# Patient Record
Sex: Male | Born: 1975
Health system: Southern US, Community
[De-identification: ages and names within clinical notes are randomized; demographics above are authoritative.]

## PROBLEM LIST (undated history)

## (undated) DIAGNOSIS — E669 Obesity, unspecified: Secondary | ICD-10-CM

## (undated) DIAGNOSIS — Q100 Congenital ptosis: Secondary | ICD-10-CM

## (undated) HISTORY — DX: Obesity, unspecified: E66.9

## (undated) HISTORY — DX: Congenital ptosis: Q10.0

---

## 1998-09-29 ENCOUNTER — Emergency Department (HOSPITAL_COMMUNITY): Admission: EM | Admit: 1998-09-29 | Discharge: 1998-09-29 | Payer: Self-pay | Admitting: Emergency Medicine

## 1998-11-24 ENCOUNTER — Emergency Department (HOSPITAL_COMMUNITY): Admission: EM | Admit: 1998-11-24 | Discharge: 1998-11-24 | Payer: Self-pay | Admitting: Emergency Medicine

## 1998-11-24 ENCOUNTER — Encounter: Payer: Self-pay | Admitting: Emergency Medicine

## 2000-07-28 ENCOUNTER — Emergency Department (HOSPITAL_COMMUNITY): Admission: EM | Admit: 2000-07-28 | Discharge: 2000-07-28 | Payer: Self-pay | Admitting: Internal Medicine

## 2001-09-01 ENCOUNTER — Emergency Department (HOSPITAL_COMMUNITY): Admission: EM | Admit: 2001-09-01 | Discharge: 2001-09-01 | Payer: Self-pay | Admitting: Emergency Medicine

## 2001-10-05 ENCOUNTER — Encounter: Payer: Self-pay | Admitting: Emergency Medicine

## 2001-10-05 ENCOUNTER — Emergency Department (HOSPITAL_COMMUNITY): Admission: EM | Admit: 2001-10-05 | Discharge: 2001-10-05 | Payer: Self-pay | Admitting: Emergency Medicine

## 2003-01-21 ENCOUNTER — Emergency Department (HOSPITAL_COMMUNITY): Admission: EM | Admit: 2003-01-21 | Discharge: 2003-01-21 | Payer: Self-pay | Admitting: Emergency Medicine

## 2006-11-08 ENCOUNTER — Emergency Department (HOSPITAL_COMMUNITY): Admission: EM | Admit: 2006-11-08 | Discharge: 2006-11-08 | Payer: Self-pay | Admitting: Emergency Medicine

## 2007-12-08 ENCOUNTER — Emergency Department (HOSPITAL_COMMUNITY): Admission: EM | Admit: 2007-12-08 | Discharge: 2007-12-08 | Payer: Self-pay | Admitting: Emergency Medicine

## 2008-04-29 ENCOUNTER — Encounter: Admission: RE | Admit: 2008-04-29 | Discharge: 2008-04-29 | Payer: Self-pay | Admitting: Internal Medicine

## 2009-03-28 ENCOUNTER — Emergency Department (HOSPITAL_COMMUNITY): Admission: EM | Admit: 2009-03-28 | Discharge: 2009-03-29 | Payer: Self-pay | Admitting: Emergency Medicine

## 2010-04-17 ENCOUNTER — Emergency Department (HOSPITAL_COMMUNITY)
Admission: EM | Admit: 2010-04-17 | Discharge: 2010-04-17 | Payer: Self-pay | Source: Home / Self Care | Admitting: Emergency Medicine

## 2010-07-18 LAB — WOUND CULTURE: Gram Stain: NONE SEEN

## 2011-12-03 ENCOUNTER — Ambulatory Visit (INDEPENDENT_AMBULATORY_CARE_PROVIDER_SITE_OTHER): Payer: BC Managed Care – PPO | Admitting: Internal Medicine

## 2011-12-03 VITALS — BP 103/66 | HR 52 | Temp 97.7°F | Resp 18 | Ht 69.0 in | Wt 191.0 lb

## 2011-12-03 DIAGNOSIS — R42 Dizziness and giddiness: Secondary | ICD-10-CM

## 2011-12-03 DIAGNOSIS — M791 Myalgia, unspecified site: Secondary | ICD-10-CM

## 2011-12-03 DIAGNOSIS — IMO0001 Reserved for inherently not codable concepts without codable children: Secondary | ICD-10-CM

## 2011-12-03 DIAGNOSIS — R5381 Other malaise: Secondary | ICD-10-CM

## 2011-12-03 DIAGNOSIS — Z0000 Encounter for general adult medical examination without abnormal findings: Secondary | ICD-10-CM

## 2011-12-03 DIAGNOSIS — R5383 Other fatigue: Secondary | ICD-10-CM

## 2011-12-03 LAB — POCT CBC
Granulocyte percent: 31.7 %G — AB (ref 37–80)
HCT, POC: 46.4 % (ref 43.5–53.7)
Hemoglobin: 14.8 g/dL (ref 14.1–18.1)
Lymph, poc: 2.8 (ref 0.6–3.4)
MCH, POC: 29.1 pg (ref 27–31.2)
MCHC: 31.9 g/dL (ref 31.8–35.4)
MCV: 91.2 fL (ref 80–97)
MID (cbc): 0.3 (ref 0–0.9)
MPV: 12.1 fL (ref 0–99.8)
POC Granulocyte: 1.4 — AB (ref 2–6.9)
POC LYMPH PERCENT: 61.3 %L — AB (ref 10–50)
POC MID %: 7 %M (ref 0–12)
Platelet Count, POC: 178 10*3/uL (ref 142–424)
RBC: 5.09 M/uL (ref 4.69–6.13)
RDW, POC: 13.9 %
WBC: 4.5 10*3/uL — AB (ref 4.6–10.2)

## 2011-12-03 LAB — POCT URINALYSIS DIPSTICK
Blood, UA: NEGATIVE
Nitrite, UA: NEGATIVE
Urobilinogen, UA: 4
pH, UA: 7

## 2011-12-03 NOTE — Progress Notes (Signed)
Subjective:    Patient ID: Arthur Gallagher, male    DOB: Nov 12, 1975, 36 y.o.   MRN: [id number]  HPIHere for a complete physical examination  He complains of fatigue over the last 6 weeks He also has a feeling of dullness in his legs every night, in the big muscles of the legs, with occasional aching pain He works a labor job with steel/long hours He's not been working out in [employer] his usual Appetite is fine/no weight loss/no injury/no dehydration Sleeps well He believes his underlying problem has to do with an infection that is spreading from his mouth to his body/his teeth are in very terrible condition His dentist tells him that he was born with "soft teeth" and that  little he can do about this except replace them  No known underlying illnesses  Family history stable Social history-married 2 kids Review of Systems  Constitutional: Negative for fever, appetite change and unexpected weight change.  HENT: Negative for hearing loss, trouble swallowing and neck pain.   Eyes: Negative for visual disturbance.  Respiratory: Negative for cough, chest tightness and shortness of breath.   Cardiovascular: Negative for chest pain, palpitations and leg swelling.  Gastrointestinal: Negative for nausea, vomiting, abdominal pain, diarrhea and constipation.  Genitourinary: Negative for dysuria, frequency, decreased urine volume, difficulty urinating and genital sores.  Musculoskeletal: Negative for back pain, joint swelling and gait problem.  Neurological: Positive for dizziness.  Hematological: Does not bruise/bleed easily.  Psychiatric/Behavioral: Negative for behavioral problems, disturbed wake/sleep cycle and dysphoric mood.  His dizziness is occasional brief and occurs with position change very early in the morning and disappears by the time he is at work/no dizziness while working hard     Objective:   Physical Exam Filed Vitals:   12/03/11 2038  BP: 103/66  Pulse: 52  Temp: 97.7 F  (36.5 C)  Resp: 18   No acute distress/well-developed well-nourished HEENT is clear except for very slight ptosis but no loss of upward gaze No nodes or thyromegaly Lungs clear Heart regular without murmur or click Abdomen soft without organomegaly or tenderness Extremities clear without edema Joints with full range of motion no pain The large muscles of the leg show no masses defects or tenderness Spine is straight/straight leg raise is normal Neurological is intact       Results for orders placed in visit on 12/03/11  POCT CBC      Component Value Range   WBC 4.5 (*) 4.6 - 10.2 K/uL   Lymph, poc 2.8  0.6 - 3.4   POC LYMPH PERCENT 61.3 (*) 10 - 50 %L   MID (cbc) 0.3  0 - 0.9   POC MID % 7.0  0 - 12 %M   POC Granulocyte 1.4 (*) 2 - 6.9   Granulocyte percent 31.7 (*) 37 - 80 %G   RBC 5.09  4.69 - 6.13 M/uL   Hemoglobin 14.8  14.1 - 18.1 g/dL   HCT, POC 40.9  81.1 - 53.7 %   MCV 91.2  80 - 97 fL   MCH, POC 29.1  27 - 31.2 pg   MCHC 31.9  31.8 - 35.4 g/dL   RDW, POC 91.4     Platelet Count, POC 178  142 - 424 K/uL   MPV 12.1  0 - 99.8 fL  POCT URINALYSIS DIPSTICK      Component Value Range   Color, UA yellow     Clarity, UA clear     Glucose,  UA neg     Bilirubin, UA neg     Ketones, UA neg     Spec Grav, UA 1.025     Blood, UA neg     pH, UA 7.0     Protein, UA neg     Urobilinogen, UA 4.0     Nitrite, UA neg     Leukocytes, UA Negative      Assessment & Plan:   1. Myalgia  TSH, CK, Sedimentation Rate  2. Fatigue  POCT CBC, Comprehensive metabolic panel, Sedimentation Rate  3. Dizzy  POCT CBC, Sedimentation Rate  4. Annual physical exam  Lipid panel, POCT urinalysis dipstick  5.  Poor dentition  Screening labs to rule out myositis Other routine labs Encouraged to resume exercise program Encouraged dental followup is planned for removal of all teeth

## 2011-12-04 ENCOUNTER — Encounter: Payer: Self-pay | Admitting: Internal Medicine

## 2011-12-04 LAB — COMPREHENSIVE METABOLIC PANEL
ALT: 12 U/L (ref 0–53)
AST: 17 U/L (ref 0–37)
Albumin: 4.1 g/dL (ref 3.5–5.2)
Alkaline Phosphatase: 48 U/L (ref 39–117)
BUN: 10 mg/dL (ref 6–23)
CO2: 29 mEq/L (ref 19–32)
Calcium: 9 mg/dL (ref 8.4–10.5)
Chloride: 104 mEq/L (ref 96–112)
Creat: 1.03 mg/dL (ref 0.50–1.35)
Glucose, Bld: 86 mg/dL (ref 70–99)
Potassium: 4 mEq/L (ref 3.5–5.3)
Sodium: 140 mEq/L (ref 135–145)
Total Bilirubin: 1.1 mg/dL (ref 0.3–1.2)
Total Protein: 7.1 g/dL (ref 6.0–8.3)

## 2011-12-04 LAB — CK: Total CK: 221 U/L (ref 7–232)

## 2011-12-04 LAB — LIPID PANEL
LDL Cholesterol: 115 mg/dL — ABNORMAL HIGH (ref 0–99)
Total CHOL/HDL Ratio: 4.3 Ratio
VLDL: 24 mg/dL (ref 0–40)

## 2011-12-04 LAB — TSH: TSH: 2.25 u[IU]/mL (ref 0.350–4.500)

## 2011-12-04 LAB — SEDIMENTATION RATE: Sed Rate: 1 mm/hr (ref 0–16)

## 2011-12-08 ENCOUNTER — Encounter: Payer: Self-pay | Admitting: Internal Medicine

## 2012-08-04 ENCOUNTER — Encounter (HOSPITAL_COMMUNITY): Payer: Self-pay | Admitting: Emergency Medicine

## 2012-08-04 ENCOUNTER — Emergency Department (INDEPENDENT_AMBULATORY_CARE_PROVIDER_SITE_OTHER)
Admission: EM | Admit: 2012-08-04 | Discharge: 2012-08-04 | Disposition: A | Discharge status: Home / Self Care | Payer: BC Managed Care – PPO | Source: Home / Self Care | Attending: Family Medicine | Admitting: Family Medicine

## 2012-08-04 DIAGNOSIS — L02419 Cutaneous abscess of limb, unspecified: Secondary | ICD-10-CM

## 2012-08-04 DIAGNOSIS — IMO0002 Reserved for concepts with insufficient information to code with codable children: Secondary | ICD-10-CM

## 2012-08-04 MED ORDER — DOXYCYCLINE HYCLATE 100 MG PO CAPS: 100.0000 mg | ORAL_CAPSULE | Freq: Two times a day (BID) | ORAL | Status: DC

## 2012-08-04 MED ORDER — HYDROCODONE-ACETAMINOPHEN 5-325 MG PO TABS: 1.0000 | ORAL_TABLET | Freq: Four times a day (QID) | ORAL | Status: DC | PRN

## 2012-08-04 NOTE — ED Provider Notes (Signed)
History     CSN: [id number]  Arrival date & time 08/04/12  1732   First MD Initiated Contact with Patient 08/04/12 1744      Chief Complaint  Patient presents with  . Abscess    (Consider location/radiation/quality/duration/timing/severity/associated sxs/prior treatment) Patient is a 37 y.o. male presenting with abscess. The history is provided by the patient.  Abscess Location:  Shoulder/arm Shoulder/arm abscess location:  L axilla Abscess quality: fluctuance and induration   Red streaking: no   Duration:  2 days Progression:  Worsening Chronicity:  New   History reviewed. No pertinent past medical history.  History reviewed. No pertinent past surgical history.  No family history on file.  History  Substance Use Topics  . Smoking status: Former Games developer  . Smokeless tobacco: Never Used  . Alcohol Use: No      Review of Systems  Constitutional: Negative.   Skin: Positive for rash.    Allergies  Review of patient's allergies indicates no known allergies.  Home Medications   Current Outpatient Rx  Name  Route  Sig  Dispense  Refill  . doxycycline (VIBRAMYCIN) 100 MG capsule   Oral   Take 1 capsule (100 mg total) by mouth 2 (two) times daily.   20 capsule   0   . HYDROcodone-acetaminophen (NORCO/VICODIN) 5-325 MG per tablet   Oral   Take 1 tablet by mouth every 6 (six) hours as needed for pain.   10 tablet   0     BP 122/79  Pulse 90  Temp(Src) 98.4 F (36.9 C) (Oral)  Resp 20  SpO2 98%  Physical Exam  Constitutional: He is oriented to person, place, and time. He appears well-developed and well-nourished.  Musculoskeletal: He exhibits tenderness.  Neurological: He is alert and oriented to person, place, and time.  Skin: Skin is warm and dry.  tendeness of abcess to left axilla    ED Course  INCISION AND DRAINAGE Date/Time: 08/04/2012 6:49 PM Performed by: Linna Hoff Authorized by: Bradd Canary D Consent: Verbal consent  obtained. Consent given by: patient Type: abscess Body area: upper extremity Local anesthetic: topical anesthetic Patient sedated: no Scalpel size: 11 Incision type: single straight Complexity: simple Drainage: purulent Drainage amount: moderate Wound treatment: wound left open Patient tolerance: Patient tolerated the procedure well with no immediate complications. Comments: Culture obtained.dsd.   (including critical care time)  Labs Reviewed  CULTURE, ROUTINE-ABSCESS   No results found.   1. Axillary abscess       MDM  I+d performed.        Linna Hoff, MD 08/04/12 [phone]

## 2012-08-04 NOTE — ED Notes (Signed)
Abscess under left arm, noticed this Saturday.  Area is getting larger and more tender.

## 2012-08-06 ENCOUNTER — Encounter (HOSPITAL_COMMUNITY): Payer: Self-pay | Admitting: *Deleted

## 2012-08-06 ENCOUNTER — Emergency Department (HOSPITAL_COMMUNITY): Payer: BC Managed Care – PPO

## 2012-08-06 ENCOUNTER — Emergency Department (HOSPITAL_COMMUNITY)
Admission: EM | Admit: 2012-08-06 | Discharge: 2012-08-07 | Disposition: A | Discharge status: Home / Self Care | Payer: BC Managed Care – PPO | Attending: Emergency Medicine | Admitting: Emergency Medicine

## 2012-08-06 DIAGNOSIS — IMO0002 Reserved for concepts with insufficient information to code with codable children: Secondary | ICD-10-CM | POA: Insufficient documentation

## 2012-08-06 DIAGNOSIS — L03019 Cellulitis of unspecified finger: Secondary | ICD-10-CM | POA: Insufficient documentation

## 2012-08-06 DIAGNOSIS — L03012 Cellulitis of left finger: Secondary | ICD-10-CM

## 2012-08-06 DIAGNOSIS — L02519 Cutaneous abscess of unspecified hand: Secondary | ICD-10-CM | POA: Insufficient documentation

## 2012-08-06 NOTE — ED Notes (Signed)
Pt states he has a piece of steel in his finger from 2 days ago, swollen and painful.

## 2012-08-07 LAB — CULTURE, ROUTINE-ABSCESS

## 2012-08-07 MED ORDER — SULFAMETHOXAZOLE-TRIMETHOPRIM 800-160 MG PO TABS: 1.0000 | ORAL_TABLET | Freq: Two times a day (BID) | ORAL | Status: DC

## 2012-08-07 MED ORDER — SULFAMETHOXAZOLE-TMP DS 800-160 MG PO TABS
1.0000 | ORAL_TABLET | Freq: Once | ORAL | Status: AC
  Administered 2012-08-07: 1 via ORAL
  Filled 2012-08-07: qty 1

## 2012-08-07 MED ORDER — CEPHALEXIN 250 MG PO CAPS: 250.0000 mg | ORAL_CAPSULE | Freq: Once | ORAL | Status: DC

## 2012-08-07 MED ORDER — CEPHALEXIN 500 MG PO CAPS: 500.0000 mg | ORAL_CAPSULE | Freq: Four times a day (QID) | ORAL | Status: DC

## 2012-08-07 MED ORDER — HYDROCODONE-ACETAMINOPHEN 5-325 MG PO TABS
1.0000 | ORAL_TABLET | Freq: Once | ORAL | Status: AC
  Administered 2012-08-07: 1 via ORAL
  Filled 2012-08-07: qty 1

## 2012-08-07 MED ORDER — CEPHALEXIN 500 MG PO CAPS
500.0000 mg | ORAL_CAPSULE | Freq: Once | ORAL | Status: AC
  Administered 2012-08-07: 500 mg via ORAL
  Filled 2012-08-07: qty 1

## 2012-08-07 NOTE — ED Provider Notes (Signed)
History     CSN: [id number]  Arrival date & time 08/06/12  2244   First MD Initiated Contact with Patient 08/07/12 0011      Chief Complaint  Patient presents with  . Finger Injury    rt index    (Consider location/radiation/quality/duration/timing/severity/associated sxs/prior treatment) HPI Comments: Patient presents with complaint of left second digit finger pain and swelling that began approximately 2 days ago. Patient states that he had something similar in the past as needed drained. 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 No fevers. No treatments prior to arrival. Areas not draining. Pain is worse with movement. Onset of symptoms gradual. Course is constant.   The history is provided by the patient.    History reviewed. No pertinent past medical history.  History reviewed. No pertinent past surgical history.  No family history on file.  History  Substance Use Topics  . Smoking status: Former Games developer  . Smokeless tobacco: Never Used  . Alcohol Use: No      Review of Systems  Constitutional: Negative for fever.  HENT: Negative for sore throat and rhinorrhea.   Eyes: Negative for redness.  Respiratory: Negative for cough.   Cardiovascular: Negative for chest pain.  Gastrointestinal: Negative for nausea, vomiting, abdominal pain and diarrhea.  Genitourinary: Negative for dysuria.  Musculoskeletal: Positive for joint swelling and arthralgias. Negative for myalgias.  Skin: Positive for color change. Negative for rash.  Neurological: Negative for headaches.    Allergies  Review of patient's allergies indicates no known allergies.  Home Medications   Current Outpatient Rx  Name  Route  Sig  Dispense  Refill  . HYDROcodone-acetaminophen (NORCO/VICODIN) 5-325 MG per tablet   Oral   Take 1 tablet by mouth every 6 (six) hours as needed for pain.   10 tablet   0     BP 129/76  Pulse 79  Temp(Src) 98.8 F (37.1  C) (Oral)  Resp 16  Wt 202 lb (91.627 kg)  BMI 29.82 kg/m2  SpO2 98%  Physical Exam  Nursing note and vitals reviewed. Constitutional: He appears well-developed and well-nourished.  HENT:  Head: Normocephalic and atraumatic.  Eyes: Conjunctivae are normal.  Neck: Normal range of motion. Neck supple.  Cardiovascular: Normal pulses.   Musculoskeletal: He exhibits tenderness. He exhibits no edema.       Right wrist: Normal.       Right hand: He exhibits decreased range of motion, tenderness and swelling. He exhibits no bony tenderness and normal capillary refill. Normal sensation noted. Normal strength noted.       Hands: Neurological: He is alert. No sensory deficit.  Motor, sensation, and vascular distal to the injury is fully intact.   Skin: Skin is warm and dry.  Psychiatric: He has a normal mood and affect.    ED Course  Procedures (including critical care time)  Labs Reviewed - No data to display Dg Finger Index Right  08/06/2012  *RADIOLOGY REPORT*  Clinical Data: Soft tissue injury.  Laceration to tip of finger.  RIGHT INDEX FINGER 2+V  Comparison: 04/17/2010 hand films.  Findings: Soft tissue swelling about the distal phalanx of the second digit. No acute fracture or dislocation.  No radio-opaque foreign body.  There is also apparent proximal interphalangeal joint soft tissue swelling.  IMPRESSION: Soft tissue swelling, without acute finding.   Original Report Authenticated By: Jeronimo Greaves, M.D.      1. Paronychia, left  2. Cellulitis of finger of left hand     12:56 AM Patient seen and examined. Medications ordered. D/w Dr. Rulon Abide. Patient has 0/4 Kanavel's signs.   Vital signs reviewed and are as follows: Filed Vitals:   08/06/12 2250  BP: 129/76  Pulse: 79  Temp: 98.8 F (37.1 C)  Resp: 16   INCISION AND DRAINAGE Performed by: Carolee Rota Consent: Verbal consent obtained. Risks and benefits: risks, benefits and alternatives were discussed Type:  abscess  Body area: L 2nd digit paronychia  Anesthesia: local infiltration  Incision was made with a scalpel.  Local anesthetic: lidocaine 2% without epinephrine  Anesthetic total: 1 ml  Complexity: complex Blunt dissection to break up loculations  Drainage: purulent  Drainage amount: moderate  Packing material: none  Patient tolerance: Patient tolerated the procedure well with no immediate complications.   Patient treated with Keflex and Bactrim in emergency department.  Patient urged to return in 48 hours for recheck, sooner with worsening pain, swelling, or fever. Orthopedic hand referral given.  Patient counseled on use of narcotic pain medications. Counseled not to combine these medications with others containing tylenol. Urged not to drink alcohol, drive, or perform any other activities that requires focus while taking these medications. The patient verbalizes understanding and agrees with the plan.  Pt urged to return with worsening pain, worsening swelling, expanding area of redness or streaking up extremity, fever, or any other concerns. Urged to take complete course of antibiotics as prescribed.  Counseled to take pain medications as prescribed. Pt verbalizes understanding and agrees with plan.   MDM  Patient with paronychia with extension of cellulitis to MCP. Patient does not have pain with passive extension, tenderness over her flexor tendon, fusiform swelling of flexor surface, does not hold finger in flexion. Given infection of finger, patient treated with Keflex and Bactrim. Encouraged return in 48 hours for recheck, sooner if worsening. Patient appears well, nontoxic. No comorbidities to preclude outpatient treatment -- however feel close f/u is warranted.         Renne Crigler, PA-C 08/07/12 [phone]

## 2012-08-07 NOTE — ED Notes (Addendum)
rx x 2 given for bactrim and keflex- pt has a ride at bedside- work note given per EDPA Rhea Bleacher

## 2012-08-07 NOTE — ED Provider Notes (Signed)
Medical screening examination/treatment/procedure(s) were performed by non-physician practitioner and as supervising physician I was immediately available for consultation/collaboration.  John-Adam Raynah Gomes, M.D.   John-Adam Yareliz Thorstenson, MD 08/07/12 0840 

## 2012-08-08 ENCOUNTER — Telehealth (HOSPITAL_COMMUNITY): Payer: Self-pay | Admitting: *Deleted

## 2012-08-08 NOTE — ED Notes (Signed)
Abscess culture L axilla: Mod. MRSA.  Pt. adequately treated with Septra DS and also got Keflex.  I called pt.  Pt. verified x 2 and given results. Pt. Told he was adequately treated and given the [facility] MRSA instructions. Pt. voiced understanding. Vassie Moselle 08/08/2012

## 2012-08-09 ENCOUNTER — Telehealth (HOSPITAL_COMMUNITY): Payer: Self-pay | Admitting: Emergency Medicine

## 2012-08-09 LAB — WOUND CULTURE

## 2012-08-10 ENCOUNTER — Telehealth (HOSPITAL_COMMUNITY): Payer: Self-pay | Admitting: Emergency Medicine

## 2012-08-10 NOTE — ED Notes (Signed)
Patient has +Wound culture. °

## 2012-08-10 NOTE — ED Notes (Signed)
+  Wound. +MRSA. Patient treated with Septra DS. Sensitive to same. Per protocol MD. Will call and inform patient of +MRSA and appropriate treatment.

## 2012-08-12 ENCOUNTER — Telehealth (HOSPITAL_COMMUNITY): Payer: Self-pay | Admitting: Emergency Medicine

## 2012-08-12 NOTE — ED Notes (Signed)
Pt called back and left message on our voice mail.  Pt's call was returned by myself and pt given results and instructions.  Pt sts has not gotten rx for Septra DS yet, pt advised to get this medication to treat his wound infection.

## 2012-08-12 NOTE — ED Notes (Signed)
Unable to contact patient via phone. Sent letter. °

## 2013-01-07 ENCOUNTER — Telehealth: Payer: Self-pay

## 2013-01-07 NOTE — Telephone Encounter (Signed)
Not sure what they are he should come in I am sure one of our providers should be able to advise. Called, recording states he is unavailable.

## 2013-01-07 NOTE — Telephone Encounter (Signed)
Pt has small bumps under arm. Would like advice on what they could be/ what to do. Told pt should probably come in if concerned.  Call at [phone].

## 2013-01-08 NOTE — Telephone Encounter (Signed)
Called again. Was able to leave message for him to call me back.

## 2013-06-23 ENCOUNTER — Encounter: Payer: Self-pay | Admitting: Medical

## 2013-06-23 ENCOUNTER — Ambulatory Visit (INDEPENDENT_AMBULATORY_CARE_PROVIDER_SITE_OTHER): Payer: BC Managed Care – PPO | Admitting: Medical

## 2013-06-23 VITALS — BP 110/80 | HR 60 | Temp 98.0°F | Resp 14 | Ht 69.0 in | Wt 207.0 lb

## 2013-06-23 DIAGNOSIS — I872 Venous insufficiency (chronic) (peripheral): Secondary | ICD-10-CM

## 2013-06-23 DIAGNOSIS — E669 Obesity, unspecified: Secondary | ICD-10-CM

## 2013-06-23 DIAGNOSIS — Z833 Family history of diabetes mellitus: Secondary | ICD-10-CM

## 2013-06-23 DIAGNOSIS — R631 Polydipsia: Secondary | ICD-10-CM

## 2013-06-23 LAB — COMPREHENSIVE METABOLIC PANEL
ALT: 28 U/L (ref 0–53)
AST: 22 U/L (ref 0–37)
Albumin: 4 g/dL (ref 3.5–5.2)
Alkaline Phosphatase: 61 U/L (ref 39–117)
BILIRUBIN TOTAL: 0.9 mg/dL (ref 0.2–1.2)
BUN: 13 mg/dL (ref 6–23)
CALCIUM: 9.3 mg/dL (ref 8.4–10.5)
CO2: 28 meq/L (ref 19–32)
Chloride: 103 mEq/L (ref 96–112)
Creat: 1.04 mg/dL (ref 0.50–1.35)
GLUCOSE: 72 mg/dL (ref 70–99)
Potassium: 4 mEq/L (ref 3.5–5.3)
SODIUM: 138 meq/L (ref 135–145)
TOTAL PROTEIN: 7.5 g/dL (ref 6.0–8.3)

## 2013-06-23 LAB — TSH: TSH: 0.958 u[IU]/mL (ref 0.350–4.500)

## 2013-06-23 LAB — CBC
HCT: 42.5 % (ref 39.0–52.0)
HEMOGLOBIN: 14.9 g/dL (ref 13.0–17.0)
MCH: 29.2 pg (ref 26.0–34.0)
MCHC: 35.1 g/dL (ref 30.0–36.0)
MCV: 83.3 fL (ref 78.0–100.0)
PLATELETS: 208 10*3/uL (ref 150–400)
RBC: 5.1 MIL/uL (ref 4.22–5.81)
RDW: 14.1 % (ref 11.5–15.5)
WBC: 3.4 10*3/uL — ABNORMAL LOW (ref 4.0–10.5)

## 2013-06-23 LAB — HEMOGLOBIN A1C
HEMOGLOBIN A1C: 5.3 % (ref <5.7)
MEAN PLASMA GLUCOSE: 105 mg/dL (ref <117)

## 2013-06-23 NOTE — Patient Instructions (Signed)
Venous Stasis or Chronic Venous Insufficiency Chronic venous insufficiency, also called venous stasis, is a condition that affects the veins in the legs. The condition prevents blood from being pumped through these veins effectively. Blood may no longer be pumped effectively from the legs back to the heart. This condition can range from mild to severe. With proper treatment, you should be able to continue with an active life. CAUSES  Chronic venous insufficiency occurs when the vein walls become stretched, weakened, or damaged or when valves within the vein are damaged. Some common causes of this include:  High blood pressure inside the veins (venous hypertension).  Increased blood pressure in the leg veins from long periods of sitting or standing.  A blood clot that blocks blood flow in a vein (deep vein thrombosis).  Inflammation of a superficial vein (phlebitis) that causes a blood clot to form. RISK FACTORS Various things can make you more likely to develop chronic venous insufficiency, including:  Family history of this condition.  Obesity.  Pregnancy.  Sedentary lifestyle.  Smoking.  Jobs requiring long periods of standing or sitting in one place.  Being a certain age. Women in their 40s and 50s and men in their 70s are more likely to develop this condition. SIGNS AND SYMPTOMS  Symptoms may include:   Varicose veins.  Skin breakdown or ulcers.  Reddened or discolored skin on the leg.  Brown, smooth, tight, and painful skin just above the ankle, usually on the inside surface (lipodermatosclerosis).  Swelling. DIAGNOSIS  To diagnose this condition, your health care provider will take a medical history and do a physical exam. The following tests may be ordered to confirm the diagnosis:  Duplex ultrasound A procedure that produces a picture of a blood vessel and nearby organs and also provides information on blood flow through the blood vessel.  Plethysmography A  procedure that tests blood flow.  A venogram, or venography A procedure used to look at the veins using X-ray and dye. TREATMENT The goals of treatment are to help you return to an active life and to minimize pain or disability. Treatment will depend on the severity of the condition. Medical procedures may be needed for severe cases. Treatment options may include:   Use of compression stockings. These can help with symptoms and lower the chances of the problem getting worse, but they do not cure the problem.  Sclerotherapy A procedure involving an injection of a material that "dissolves" the damaged veins. Other veins in the network of blood vessels take over the function of the damaged veins.  Surgery to remove the vein or cut off blood flow through the vein (vein stripping or laser ablation surgery).  Surgery to repair a valve. HOME CARE INSTRUCTIONS   Wear compression stockings as directed by your health care provider.  Only take over-the-counter or prescription medicines for pain, discomfort, or fever as directed by your health care provider.  Follow up with your health care provider as directed. SEEK MEDICAL CARE IF:   You have redness, swelling, or increasing pain in the affected area.  You see a red streak or line that extends up or down from the affected area.  You have a breakdown or loss of skin in the affected area, even if the breakdown is small.  You have an injury to the affected area. SEEK IMMEDIATE MEDICAL CARE IF:   You have an injury and open wound in the affected area.  Your pain is severe and does not improve with   medicine.  You have sudden numbness or weakness in the foot or ankle below the affected area, or you have trouble moving your foot or ankle.  You have a fever or persistent symptoms for more than 2 3 days.  You have a fever and your symptoms suddenly get worse. MAKE SURE YOU:   Understand these instructions.  Will watch your condition.  Will  get help right away if you are not doing well or get worse. Document Released: 08/06/2006 Document Revised: 01/21/2013 Document Reviewed: 12/08/2012 [employer] Patient Information 2014 [employer].  

## 2013-06-23 NOTE — Progress Notes (Signed)
   Subjective:   Arthur Gallagher is a 38 y.o. male presenting on 06/23/2013 with bilateral leg weakness  Here as a new patient today to establish care.  Has concerns about months of right leg swelling.  Denies injury, trauma, prior DVT/PE, no recent long travel.   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  He wants testing for diabetes given the swelling and mom's history.  Otherwise in usual state of health. No prior primary care in a long time, no prior routine lab testing.   [place].  Here with his son for recheck.  No other aggravating or relieving factors.  No other complaint.  Review of Systems ROS as in subjective      Objective:    Objective: Filed Vitals:   06/23/13 1602  BP: 110/80  Pulse: 60  Temp: 98 F (36.7 C)  Resp: 14    General appearance: alert, no distress, WD/WN Neck: supple, no lymphadenopathy, no thyromegaly, no masses Heart: RRR, normal S1, S2, no murmurs Lungs: CTA bilaterally, no wheezes, rhonchi, or rales Abdomen: +bs, soft, non tender, non distended, no masses, no hepatomegaly, no splenomegaly Pulses: 2+ symmetric, upper and lower extremities, normal cap refill Ext: mild 1+ right LE generalized edema, nonpitting, some skin scaling, leg suggestive of venous insufficiency, scar of anterior vertical lower leg      Assessment: Encounter Diagnoses  Name Primary?  . Venous insufficiency Yes  . Polydipsia   . Family history of diabetes mellitus   . Obesity, unspecified      Plan:  Discussed diagnosis of venous insufficiency, treatment recommendations including exercise, leg elevation, compression hose.  Advised he work on healthy lifestyle through diet and exercise changes.   Work on weight loss.  Routine labs today.  Arthur Gallagher was seen today for bilateral leg weakness.  Diagnoses and associated orders for this visit:  Venous insufficiency - TSH - Comprehensive metabolic panel - Hemoglobin  A1c - CBC  Polydipsia - TSH - Comprehensive metabolic panel - Hemoglobin A1c - CBC  Family history of diabetes mellitus - TSH - Comprehensive metabolic panel - Hemoglobin A1c - CBC  Obesity, unspecified - TSH - Comprehensive metabolic panel - Hemoglobin A1c - CBC    Return pending labs.

## 2014-05-24 ENCOUNTER — Encounter: Payer: Self-pay | Admitting: Medical

## 2014-05-24 ENCOUNTER — Ambulatory Visit (INDEPENDENT_AMBULATORY_CARE_PROVIDER_SITE_OTHER): Payer: BLUE CROSS/BLUE SHIELD | Admitting: Medical

## 2014-05-24 VITALS — BP 110/80 | HR 67 | Temp 97.9°F | Resp 16 | Ht 69.0 in | Wt 204.0 lb

## 2014-05-24 DIAGNOSIS — Z202 Contact with and (suspected) exposure to infections with a predominantly sexual mode of transmission: Secondary | ICD-10-CM

## 2014-05-24 DIAGNOSIS — I8311 Varicose veins of right lower extremity with inflammation: Secondary | ICD-10-CM

## 2014-05-24 DIAGNOSIS — K029 Dental caries, unspecified: Secondary | ICD-10-CM

## 2014-05-24 DIAGNOSIS — I872 Venous insufficiency (chronic) (peripheral): Secondary | ICD-10-CM

## 2014-05-24 DIAGNOSIS — R29898 Other symptoms and signs involving the musculoskeletal system: Secondary | ICD-10-CM

## 2014-05-24 DIAGNOSIS — Z0000 Encounter for general adult medical examination without abnormal findings: Secondary | ICD-10-CM

## 2014-05-24 DIAGNOSIS — I8312 Varicose veins of left lower extremity with inflammation: Secondary | ICD-10-CM

## 2014-05-24 DIAGNOSIS — R0989 Other specified symptoms and signs involving the circulatory and respiratory systems: Secondary | ICD-10-CM

## 2014-05-24 LAB — POCT URINALYSIS DIPSTICK
BILIRUBIN UA: NEGATIVE
Glucose, UA: NEGATIVE
KETONES UA: NEGATIVE
LEUKOCYTES UA: NEGATIVE
Nitrite, UA: NEGATIVE
PROTEIN UA: NEGATIVE
RBC UA: NEGATIVE
Urobilinogen, UA: 0.2
pH, UA: 6

## 2014-05-24 NOTE — Addendum Note (Signed)
Addended by: Jac CanavanYSINGER, Rickesha Veracruz S on: 05/24/2014 04:33 PM   Modules accepted: Orders

## 2014-05-24 NOTE — Progress Notes (Signed)
Subjective:   HPI  Arthur Gallagher is a 39 y.o. male who presents for a complete physical.   Preventative care: Last physical or labs: FIRST PHYSICAL here Sees dentist yearly: Yes Last tetanus vaccine, TD or Tdap: 3 to 4 years ago Flu- DECLINED FLU VACCINE   Concerns: Right leg continues to feel heavy in the evenings.   Occasionally numb feeling.  No tingling, no weakness, no frank edema.   Having dentures soon.  Lately feels not well like tooth infection is draining into body.  Reviewed their medical, surgical, family, social, medication, and allergy history and updated chart as appropriate.  No past medical history on file.  No past surgical history on file.  History   Social History  . Marital Status: Married    Spouse Name: N/A    Number of Children: N/A  . Years of Education: N/A   Occupational History  . Not on file.   Social History Main Topics  . Smoking status: Never Smoker   . Smokeless tobacco: Never Used  . Alcohol Use: No  . Drug Use: No  . Sexual Activity: Not on file   Other Topics Concern  . Not on file   Social History Narrative    Family History  Problem Relation Age of Onset  . Diabetes Mother     No current outpatient prescriptions on file.  No Known Allergies   Review of Systems Constitutional: -fever, -chills, -sweats, -unexpected weight change, -decreased appetite, -fatigue Allergy: -sneezing, -itching, -congestion Dermatology: -changing moles, --rash, -lumps ENT: -runny nose, -ear pain, -sore throat, -hoarseness, -sinus pain, -teeth pain, - ringing in ears, -hearing loss, -nosebleeds Cardiology: -chest pain, -palpitations, -swelling, -difficulty breathing when lying flat, -waking up short of breath Respiratory: -cough, -shortness of breath, -difficulty breathing with exercise or exertion, -wheezing, -coughing up blood Gastroenterology: -abdominal pain, -nausea, -vomiting, -diarrhea, -constipation, -blood in stool, -changes in  bowel movement, -difficulty swallowing or eating Hematology: -bleeding, -bruising  Musculoskeletal: -joint aches, -muscle aches, -joint swelling, -back pain, -neck pain, -cramping, -changes in gait, +legs feel like they are falling sleep Ophthalmology: denies vision changes, eye redness, itching, discharge Urology: -burning with urination, -difficulty urinating, -blood in urine, -urinary frequency, -urgency, -incontinence Neurology: -headache, -weakness, -tingling, -numbness, -memory loss, -falls, -dizziness Psychology: -depressed mood, -agitation, -sleep problems     Objective:   Physical Exam  BP 110/80 mmHg  Pulse 67  Temp(Src) 97.9 F (36.6 C) (Oral)  Resp 16  Ht 5\' 9"  (1.753 m)  Wt 204 lb (92.534 kg)  BMI 30.11 kg/m2  General appearance: alert, no distress, WD/WN, AA male Skin: Distal lower legs with somewhat lichenified appearance dry and flaky suggestive of stasis dermatitis, otherwise scattered macules no worrisome lesions HEENT: normocephalic, conjunctiva/corneas normal, sclerae anicteric, PERRLA, EOMi, nares patent, no discharge or erythema, pharynx normal Oral cavity: MMM, tongue normal, teeth-several teeth missing, diffuse decay Neck: supple, no lymphadenopathy, no thyromegaly, no masses, normal ROM, no bruits Chest: non tender, normal shape and expansion Heart: RRR, normal S1, S2, no murmurs Lungs: CTA bilaterally, no wheezes, rhonchi, or rales Abdomen: +bs, soft, non tender, non distended, no masses, no hepatomegaly, no splenomegaly, no bruits Back: non tender, normal ROM, no scoliosis Musculoskeletal: upper extremities non tender, no obvious deformity, normal ROM throughout, lower extremities non tender, no obvious deformity, normal ROM throughout Extremities: no edema, no cyanosis, no clubbing Pulses: 2+ symmetric, upper and lower extremities, normal cap refill Neurological: alert, oriented x 3, CN2-12 intact, strength normal upper extremities and lower extremities,  sensation normal throughout, DTRs 2+ throughout, no cerebellar signs, gait normal Psychiatric: normal affect, behavior normal, pleasant  GU: normal male external genitalia, circumcised, nontender, no masses, no hernia, no lymphadenopathy Rectal: deferred due to age 51 years old and no indication today   Assessment and Plan :    Encounter Diagnoses  Name Primary?  . Encounter for health maintenance examination in adult Yes  . Venereal disease contact   . Tooth decay   . Decreased pulses in feet   . Leg heaviness   . Stasis dermatitis of both legs     Physical exam - discussed healthy lifestyle, diet, exercise, preventative care, vaccinations, and addressed their concerns.   See your dentist yearly for routine dental care including hygiene visits twice yearly. He will return for screenign labs  Vaccinations: Up to date on Tdap per his report.  Declines influenza vaccine  Other concerns today: Tooth decay - discussed risks of tooth decay, go for dentures soon  STD screening  Decreased pulses, leg heaviness, stasis dermatitis - supsect venous insufficiency but pulses somewhat decreased too.  Consider ABIs, venous studies  Follow up pending labs

## 2014-05-25 LAB — GC/CHLAMYDIA PROBE AMP
CT PROBE, AMP APTIMA: NEGATIVE
GC Probe RNA: NEGATIVE

## 2014-05-28 ENCOUNTER — Encounter: Payer: Self-pay | Admitting: Medical

## 2014-06-21 ENCOUNTER — Encounter (HOSPITAL_COMMUNITY): Payer: Self-pay | Admitting: Emergency Medicine

## 2014-06-21 ENCOUNTER — Emergency Department (HOSPITAL_COMMUNITY)
Admission: EM | Admit: 2014-06-21 | Discharge: 2014-06-21 | Disposition: A | Discharge status: Home / Self Care | Payer: BLUE CROSS/BLUE SHIELD | Attending: Emergency Medicine | Admitting: Emergency Medicine

## 2014-06-21 DIAGNOSIS — Y9289 Other specified places as the place of occurrence of the external cause: Secondary | ICD-10-CM | POA: Diagnosis not present

## 2014-06-21 DIAGNOSIS — Y9389 Activity, other specified: Secondary | ICD-10-CM | POA: Diagnosis not present

## 2014-06-21 DIAGNOSIS — Z87720 Personal history of (corrected) congenital malformations of eye: Secondary | ICD-10-CM | POA: Diagnosis not present

## 2014-06-21 DIAGNOSIS — Y990 Civilian activity done for income or pay: Secondary | ICD-10-CM | POA: Insufficient documentation

## 2014-06-21 DIAGNOSIS — S61209A Unspecified open wound of unspecified finger without damage to nail, initial encounter: Secondary | ICD-10-CM

## 2014-06-21 DIAGNOSIS — S6992XA Unspecified injury of left wrist, hand and finger(s), initial encounter: Secondary | ICD-10-CM | POA: Diagnosis present

## 2014-06-21 DIAGNOSIS — E669 Obesity, unspecified: Secondary | ICD-10-CM | POA: Diagnosis not present

## 2014-06-21 DIAGNOSIS — W270XXA Contact with workbench tool, initial encounter: Secondary | ICD-10-CM | POA: Insufficient documentation

## 2014-06-21 DIAGNOSIS — S61203A Unspecified open wound of left middle finger without damage to nail, initial encounter: Secondary | ICD-10-CM | POA: Insufficient documentation

## 2014-06-21 MED ORDER — BACITRACIN 500 UNIT/GM EX OINT
1.0000 "application " | TOPICAL_OINTMENT | Freq: Two times a day (BID) | CUTANEOUS | Status: DC
  Filled 2014-06-21: qty 0.9

## 2014-06-21 MED ORDER — BACITRACIN ZINC 500 UNIT/GM EX OINT: 1.0000 "application " | TOPICAL_OINTMENT | Freq: Two times a day (BID) | CUTANEOUS | Status: DC

## 2014-06-21 MED ORDER — IBUPROFEN 600 MG PO TABS: 600.0000 mg | ORAL_TABLET | Freq: Four times a day (QID) | ORAL | Status: DC | PRN

## 2014-06-21 NOTE — ED Notes (Signed)
Per pt, states he nicked his left thumb and left index finger on a saw today at work, bleeding controlled

## 2014-06-21 NOTE — ED Provider Notes (Signed)
CSN: [id number]     Arrival date & time 06/21/14  1851 History  This chart was scribed for non-physician practitioner, Antony Madura, PA-C, working with Linwood Dibbles, MD, by Ronney Lion, ED Scribe. This patient was seen in room WTR6/WTR6 and the patient's care was started at 9:16 PM.    Chief Complaint  Patient presents with  . Finger Injury   The history is provided by the patient. No language interpreter was used.    HPI Comments: Arthur Gallagher is a 39 y.o. male who is right hand-dominant who presents to the Emergency Department complaining of a left thumb and middle finger injury that occurred about 8 hours ago, when he accidentally "nicked" his left hand against against a saw at work at a [employer]. Patient states he wears gloves at work, but the saw went through the glove. Patient complains of constant, mild associated throbbing pain, with greater pain in the middle finger than the thumb. He has taken Tylenol with some pain relief. His last tetanus shot was 4 years ago. He denies blood thinner use. No associated numbness or pallor.  Past Medical History  Diagnosis Date  . Obesity   . Ptosis, congenital    History reviewed. No pertinent past surgical history. Family History  Problem Relation Age of Onset  . Diabetes Mother    History  Substance Use Topics  . Smoking status: Never Smoker   . Smokeless tobacco: Never Used  . Alcohol Use: No    Review of Systems  Skin: Positive for wound.  All other systems reviewed and are negative.   Allergies  Review of patient's allergies indicates no known allergies.  Home Medications   Prior to Admission medications   Medication Sig Start Date End Date Taking? Authorizing Provider  bacitracin ointment Apply 1 application topically 2 (two) times daily. 06/21/14   Antony Madura, PA-C  ibuprofen (ADVIL,MOTRIN) 600 MG tablet Take 1 tablet (600 mg total) by mouth every 6 (six) hours as needed. 06/21/14   Antony Madura, PA-C   BP 148/66 mmHg  Pulse  77  Temp(Src) 98 F (36.7 C) (Oral)  Resp 16  SpO2 97%   Physical Exam  Constitutional: He is oriented to person, place, and time. He appears well-developed and well-nourished. No distress.  Nontoxic/nonseptic appearing  HENT:  Head: Normocephalic and atraumatic.  Eyes: Conjunctivae and EOM are normal. No scleral icterus.  Neck: Normal range of motion.  Cardiovascular: Normal rate, regular rhythm and intact distal pulses.   Distal radial pulse 2+ and left upper extremity. Capillary refill brisk in all digits of left hand.  Pulmonary/Chest: Effort normal. No respiratory distress.  Respirations even and unlabored  Musculoskeletal: Normal range of motion.       Left hand: He exhibits tenderness and laceration. He exhibits normal range of motion, no bony tenderness, normal two-point discrimination, normal capillary refill and no swelling. Normal sensation noted. Normal strength noted.       Hands: Neurological: He is alert and oriented to person, place, and time. He exhibits normal muscle tone. Coordination normal.  Sensation to light touch intact in all digits of left hand  Skin: Skin is warm and dry. No rash noted. He is not diaphoretic. No pallor.  Psychiatric: He has a normal mood and affect. His behavior is normal.  Nursing note and vitals reviewed.   ED Course  Procedures (including critical care time)  DIAGNOSTIC STUDIES: Oxygen Saturation is 100% on room air, normal by my interpretation.  COORDINATION OF CARE: 9:23 PM - Discussed treatment plan with pt at bedside which includes irrigating the wound and applying Bacitracin gauze, and pt agreed to plan. Counseled patient on wound care, including changing the dressing daily and elevating the left hand to alleviate throbbing pain. See no signs of infection at this time, but return precautions given if patient notices signs of infection. Patient has no other questions at this time.   Labs Review Labs Reviewed - No data to  display  Imaging Review No results found.   EKG Interpretation None      MDM   Final diagnoses:  Avulsion of fingertip, initial encounter    39 year old nontoxic-appearing male presents to the emergency department for further evaluation of injury to left hand. Patient is neurovascularly intact. He is noted to have a skin avulsion to the pad of his left third digit as well as slight skin tear 2 to the pad of his left thumb. No indication for suturing at this time. Wound care provided in ED and return precautions discussed. Patient stable and appropriate for discharge. Return precautions given prior to discharge.  I personally performed the services described in this documentation, which was scribed in my presence. The recorded information has been reviewed and is accurate.   Filed Vitals:   06/21/14 1923 06/21/14 2157  BP: 122/72 148/66  Pulse: 72 77  Temp: 98.1 F (36.7 C) 98 F (36.7 C)  TempSrc: Oral Oral  Resp: 16 16  SpO2: 100% 97%      Antony MaduraKelly Nguyet Mercer, PA-C 06/21/14 2204  Linwood DibblesJon Knapp, MD 06/22/14 [phone]

## 2014-06-21 NOTE — Discharge Instructions (Signed)
Finger Avulsion  When the tip of the finger is lost, a new nail may grow back if part of the fingernail is left. The new nail may be deformed. If just the tip of the finger is lost, no repair may be needed unless there is bone showing. If bone is showing, your caregiver may need to remove the protruding bone and put on a bandage. Your caregiver will do what is best for you. Most of the time when a fingertip is lost, the end will gradually grow back on and look fairly normal, but it may remain sensitive to pressure and temperature extremes for a long time. HOME CARE INSTRUCTIONS   Keep your hand elevated above your heart to relieve pain and swelling.  Keep your dressing dry and clean.  Change your bandage in 24 hours or as directed.  Only take over-the-counter or prescription medicines for pain, discomfort, or fever as directed by your caregiver.  See your caregiver as needed for problems. SEEK MEDICAL CARE IF:   You have increased pain, swelling, drainage, or bleeding.  You have a fever.  You have swelling that spreads from your finger and into your hand. Make sure to check to see if you need a tetanus booster. Document Released: 06/11/2001 Document Revised: 10/02/2011 Document Reviewed: 05/06/2008 [facility]Exit[facility] Patient Information 2015 [place], [place]. This information is not intended to replace advice given to you by your health care provider. Make sure you discuss any questions you have with your health care provider. Wound Care Wound care helps prevent pain and infection.  You may need a tetanus shot if: You cannot remember when you had your last tetanus shot. You have never had a tetanus shot. The injury broke your skin. If you need a tetanus shot and you choose not to have one, you may get tetanus. Sickness from tetanus can be serious. HOME CARE  Only take medicine as told by your doctor. Clean the wound daily with mild soap and water. Change any bandages (dressings) as told by your  doctor. Put medicated cream and a bandage on the wound as told by your doctor. Change the bandage if it gets wet, dirty, or starts to smell. Take showers. Do not take baths, swim, or do anything that puts your wound under water. Rest and raise (elevate) the wound until the pain and puffiness (swelling) are better. Keep all doctor visits as told. GET HELP RIGHT AWAY IF:  Yellowish-white fluid (pus) comes from the wound. Medicine does not lessen your pain. There is a red streak going away from the wound. You have a fever. MAKE SURE YOU:  Understand these instructions. Will watch your condition. Will get help right away if you are not doing well or get worse. Document Released: 01/10/2008 Document Revised: 06/25/2011 Document Reviewed: 08/06/2010 [facility]Exit[facility] Patient Information 2015 [place] StationExitCare, [place]. This information is not intended to replace advice given to you by your health care provider. Make sure you discuss any questions you have with your health care provider.

## 2014-09-18 ENCOUNTER — Encounter (HOSPITAL_COMMUNITY): Payer: Self-pay | Admitting: Emergency Medicine

## 2014-09-18 ENCOUNTER — Emergency Department (INDEPENDENT_AMBULATORY_CARE_PROVIDER_SITE_OTHER)
Admission: EM | Admit: 2014-09-18 | Discharge: 2014-09-18 | Disposition: A | Discharge status: Home / Self Care | Payer: BLUE CROSS/BLUE SHIELD | Source: Home / Self Care | Attending: Family Medicine | Admitting: Family Medicine

## 2014-09-18 DIAGNOSIS — L0100 Impetigo, unspecified: Secondary | ICD-10-CM | POA: Diagnosis not present

## 2014-09-18 DIAGNOSIS — B001 Herpesviral vesicular dermatitis: Secondary | ICD-10-CM

## 2014-09-18 MED ORDER — MUPIROCIN 2 % EX OINT: 1.0000 "application " | TOPICAL_OINTMENT | Freq: Two times a day (BID) | CUTANEOUS | Status: DC

## 2014-09-18 MED ORDER — VALACYCLOVIR HCL 1 G PO TABS: 2000.0000 mg | ORAL_TABLET | Freq: Two times a day (BID) | ORAL | Status: DC

## 2014-09-18 NOTE — Discharge Instructions (Signed)
Thank you for coming in today.   Cold Sore A cold sore (fever blister) is a skin infection caused by the herpes simplex virus (HSV-1). HSV-1 is closely related to the virus that causes genital herpes (HSV-2), but they are not the same even though both viruses can cause oral and genital infections. Cold sores are small, fluid-filled sores inside of the mouth or on the lips, gums, nose, chin, cheeks, or fingers.  The herpes simplex virus can be easily passed (contagious) to other people through close personal contact, such as kissing or sharing personal items. The virus can also spread to other parts of the body, such as the eyes or genitals. Cold sores are contagious until the sores crust over completely. They often heal within 2 weeks.  Once a person is infected, the herpes simplex virus remains permanently in the body. Therefore, there is no cure for cold sores, and they often recur when a person is tired, stressed, sick, or gets too much sun. Additional factors that can cause a recurrence include hormone changes in menstruation or pregnancy, certain drugs, and cold weather.  CAUSES  Cold sores are caused by the herpes simplex virus. The virus is spread from person to person through close contact, such as through kissing, touching the affected area, or sharing personal items such as lip balm, razors, or eating utensils.  SYMPTOMS  The first infection may not cause symptoms. If symptoms develop, the symptoms often go through different stages. Here is how a cold sore develops:   Tingling, itching, or burning is felt 1-2 days before the outbreak.   Fluid-filled blisters appear on the lips, inside the mouth, nose, or on the cheeks.   The blisters start to ooze clear fluid.   The blisters dry up and a yellow crust appears in its place.   The crust falls off.  Symptoms depend on whether it is the initial outbreak or a recurrence. Some other symptoms with the first outbreak may include:    Fever.   Sore throat.   Headache.   Muscle aches.   Swollen neck glands.  DIAGNOSIS  A diagnosis is often made based on your symptoms and looking at the sores. Sometimes, a sore may be swabbed and then examined in the lab to make a final diagnosis. If the sores are not present, blood tests can find the herpes simplex virus.  TREATMENT  There is no cure for cold sores and no vaccine for the herpes simplex virus. Within 2 weeks, most cold sores go away on their own without treatment. Medicines cannot make the infection go away, but medicine can help relieve some of the pain associated with the sores, can work to stop the virus from multiplying, and can also shorten healing time. Medicine may be in the form of creams, gels, pills, or a shot.  HOME CARE INSTRUCTIONS   Only take over-the-counter or prescription medicines for pain, discomfort, or fever as directed by your caregiver. Do not use aspirin.   Use a cotton-tip swab to apply creams or gels to your sores.   Do not touch the sores or pick the scabs. Wash your hands often. Do not touch your eyes without washing your hands first.   Avoid kissing, oral sex, and sharing personal items until sores heal.   Apply an ice pack on your sores for 10-15 minutes to ease any discomfort.   Avoid hot, cold, or salty foods because they may hurt your mouth. Eat a soft, bland diet to avoid  irritating the sores. Use a straw to drink if you have pain when drinking out of a glass.   Keep sores clean and dry to prevent an infection of other tissues.   Avoid the sun and limit stress if these things trigger outbreaks. If sun causes cold sores, apply sunscreen on the lips before being out in the sun.  SEEK MEDICAL CARE IF:   You have a fever or persistent symptoms for more than 2-3 days.   You have a fever and your symptoms suddenly get worse.   You have pus, not clear fluid, coming from the sores.   You have redness that is  spreading.   You have pain or irritation in your eye.   You get sores on your genitals.   Your sores do not heal within 2 weeks.   You have a weakened immune system.   You have frequent recurrences of cold sores.  MAKE SURE YOU:   Understand these instructions.  Will watch your condition.  Will get help right away if you are not doing well or get worse. Document Released: 03/30/2000 Document Revised: 08/17/2013 Document Reviewed: 08/15/2011 [facility]ExitCare Patient Information 2015 [place], [place]. This information is not intended to replace advice given to you by your health care provider. Make sure you discuss any questions you have with your health care provider.

## 2014-09-18 NOTE — ED Notes (Signed)
Reports possible cold sore in side of nose.  Pt has tried using blistex with no relief.  Symptoms present since Friday.

## 2014-09-18 NOTE — ED Provider Notes (Signed)
Arthur Gallagher is a 39 y.o. male who presents to Urgent Care today for irritated area right nostril present for one day. It is mildly itchy and tingly. He has a history of cold sores but none in the same area. No fevers or chills nausea vomiting or diarrhea. No treatment tried yet.   Past Medical History  Diagnosis Date  . Obesity   . Ptosis, congenital    History reviewed. No pertinent past surgical history. History  Substance Use Topics  . Smoking status: Never Smoker   . Smokeless tobacco: Never Used  . Alcohol Use: No   ROS as above Medications: No current facility-administered medications for this encounter.   Current Outpatient Prescriptions  Medication Sig Dispense Refill  . bacitracin ointment Apply 1 application topically 2 (two) times daily. 15 g 0  . ibuprofen (ADVIL,MOTRIN) 600 MG tablet Take 1 tablet (600 mg total) by mouth every 6 (six) hours as needed. 30 tablet 0  . mupirocin ointment (BACTROBAN) 2 % Place 1 application into the nose 2 (two) times daily. 30 g 0  . valACYclovir (VALTREX) 1000 MG tablet Take 2 tablets (2,000 mg total) by mouth 2 (two) times daily. 4 tablet 0   No Known Allergies   Exam:  BP 118/62 mmHg  Pulse 74  Temp(Src) 98.9 F (37.2 C) (Oral)  Resp 14  SpO2 98% Gen: Well NAD HEENT: EOMI,  MMM right nostril vesicular golden colored lesions right nostril. Lungs: Normal work of breathing. CTABL Heart: RRR no MRG Abd: NABS, Soft. Nondistended, Nontender Exts: Brisk capillary refill, warm and well perfused.   No results found for this or any previous visit (from the past 24 hour(s)). No results found.  Assessment and Plan: 39 y.o. male with oral herpes versus impetigo. Treatment with Valtrex and mupirocin ointment. Return as needed.  Discussed warning signs or symptoms. Please see discharge instructions. Patient expresses understanding.     Rodolph BongEvan S Melyna Huron, MD 09/18/14 1146

## 2015-07-20 ENCOUNTER — Telehealth: Payer: Self-pay | Admitting: Medical

## 2015-07-20 ENCOUNTER — Ambulatory Visit (INDEPENDENT_AMBULATORY_CARE_PROVIDER_SITE_OTHER): Payer: BLUE CROSS/BLUE SHIELD | Admitting: Medical

## 2015-07-20 ENCOUNTER — Encounter: Payer: Self-pay | Admitting: Medical

## 2015-07-20 VITALS — BP 102/70 | HR 84 | Wt 202.0 lb

## 2015-07-20 DIAGNOSIS — R202 Paresthesia of skin: Secondary | ICD-10-CM | POA: Diagnosis not present

## 2015-07-20 DIAGNOSIS — R0989 Other specified symptoms and signs involving the circulatory and respiratory systems: Secondary | ICD-10-CM | POA: Diagnosis not present

## 2015-07-20 NOTE — Progress Notes (Signed)
Subjective: Chief Complaint  Patient presents with  . lt leg tingling    stated it is still going on and started back about 3 months ago. said at work it doesnt bother him only when he is at home   Here for right leg issue.  He notes that he has intermittent tingling and numbness in right leg.  Its not a problem during the day at work, active at work, no numbness or pain during the day.   He is on his feet all day at work and when he comes home in the evening and sits to rest, leg seems to go numb quickly on the right.   He tends to sit and rest when he gets home.   Denies recent injury, fall, trauma.  He denies hip pain, back pain, abdominal pain, fever, no chills, no weight loss.  This has been an ongoing problem for months.  No other aggravating or relieving factors. No other complaint.  Past Medical History  Diagnosis Date  . Obesity   . Ptosis, congenital    ROS as in subjective   Objective: BP 102/70 mmHg  Pulse 84  Wt 202 lb (91.627 kg)  Gen: wd, wn, nad Skin: right anterior lower leg with long vertical linear scar from childhood trauma.   Pulses 1+ right leg, 2+ left leg No edema bilat feet and legs without deformity, normal ROM, nontender, hips nontender, normal ROM Back : nontender Arms nontender, normal ROM, no deformity     Assessment: Encounter Diagnoses  Name Primary?  . Right leg paresthesias Yes  . Decreased pulse      Plan: discussed his concerns, exam findings.  I suspect most of his symptoms are related to prolonged sitting in the same position leading to unnecessary pressure on the nerves in the upper thigh.  He has no symptoms in the day when active, only when he is seated prolonged. However, he seems to be troubled by this, and it is frequent.  Thus, will set up for ABIs.  If those are normal, can consider EMG/NCS or physical therapy referral.  F/u pending study.

## 2015-07-20 NOTE — Telephone Encounter (Signed)
Please set up for ABIs

## 2015-07-21 NOTE — Addendum Note (Signed)
Addended by: Kieth BrightlyLAWSON, Rishikesh Khachatryan M on: 07/21/2015 08:47 AM   Modules accepted: Orders

## 2015-07-21 NOTE — Telephone Encounter (Signed)
LM for the scheduler to call me back about his appt

## 2015-07-22 ENCOUNTER — Other Ambulatory Visit: Payer: Self-pay | Admitting: Medical

## 2015-07-22 DIAGNOSIS — R202 Paresthesia of skin: Secondary | ICD-10-CM

## 2015-07-22 DIAGNOSIS — R0989 Other specified symptoms and signs involving the circulatory and respiratory systems: Secondary | ICD-10-CM

## 2015-07-22 NOTE — Telephone Encounter (Signed)
Pt has abi scheduled for 4/10 2:30pm pt is aware

## 2015-07-25 ENCOUNTER — Inpatient Hospital Stay (HOSPITAL_COMMUNITY): Admission: RE | Admit: 2015-07-25 | Payer: BLUE CROSS/BLUE SHIELD | Source: Ambulatory Visit

## 2015-08-01 ENCOUNTER — Encounter (HOSPITAL_COMMUNITY): Payer: Self-pay | Admitting: Medical

## 2015-09-08 ENCOUNTER — Ambulatory Visit (HOSPITAL_COMMUNITY)
Admission: RE | Admit: 2015-09-08 | Discharge: 2015-09-08 | Disposition: A | Discharge status: Home / Self Care | Payer: BLUE CROSS/BLUE SHIELD | Source: Ambulatory Visit | Attending: Medical | Admitting: Medical

## 2015-09-08 DIAGNOSIS — R202 Paresthesia of skin: Secondary | ICD-10-CM | POA: Diagnosis not present

## 2015-09-08 DIAGNOSIS — R0989 Other specified symptoms and signs involving the circulatory and respiratory systems: Secondary | ICD-10-CM | POA: Diagnosis not present

## 2015-09-16 ENCOUNTER — Telehealth: Payer: Self-pay

## 2015-09-16 DIAGNOSIS — R202 Paresthesia of skin: Secondary | ICD-10-CM

## 2015-09-16 NOTE — Telephone Encounter (Signed)
-----   Message from Jac Canavanavid S Tysinger, PA-C sent at 09/09/2015  5:29 AM EDT ----- The leg blood flow study was normal  If agreeable refer to physical therapy for additional help with his pains.

## 2015-09-16 NOTE — Telephone Encounter (Signed)
Referral to PT.

## 2015-09-22 ENCOUNTER — Encounter: Payer: Self-pay | Admitting: Internal Medicine

## 2017-06-07 ENCOUNTER — Encounter: Payer: Self-pay | Admitting: Student in an Organized Health Care Education/Training Program

## 2017-06-07 ENCOUNTER — Other Ambulatory Visit: Payer: Self-pay

## 2017-06-07 ENCOUNTER — Ambulatory Visit (INDEPENDENT_AMBULATORY_CARE_PROVIDER_SITE_OTHER): Payer: BLUE CROSS/BLUE SHIELD | Admitting: Student in an Organized Health Care Education/Training Program

## 2017-06-07 VITALS — BP 110/70 | HR 72 | Temp 98.1°F | Ht 69.0 in | Wt 220.0 lb

## 2017-06-07 DIAGNOSIS — Z0000 Encounter for general adult medical examination without abnormal findings: Secondary | ICD-10-CM

## 2017-06-07 NOTE — Patient Instructions (Signed)
It was a pleasure seeing you today in our clinic. Today we discussed your healthcare maintenance. Here is the treatment plan we have discussed and agreed upon together:  We drew blood work at today's visit. I will call or send you a letter with these results. If you do not hear from me within the next week, please give our office a call.  Our clinic's number is [phone]. Please call with questions or concerns about what we discussed today.  Be well, Dr. Mosetta PuttFeng

## 2017-06-07 NOTE — Progress Notes (Signed)
   CC: establish care  HPI: Arthur Gallagher is a 42 y.o. male with no significant PMH who presents [facility] today to establish care as a new patient.  PMH: None  PSH: 1. Orthopedic surgery on leg in high school (right)  Family Hx: 1. 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 Lives at home with son, 41 yrs old Nonsmoker, no alcohol, no drug use  Allergies: None  Meds: None  Overdue health maintenance  Thinking about flu vaccine  Review of Symptoms:  See HPI for ROS.   CC, SH/smoking status, and VS noted.  Objective: BP 110/70   Pulse 72   Temp 98.1 F (36.7 C) (Oral)   Ht 5\' 9"  (1.753 m)   Wt 220 lb (99.8 kg)   SpO2 96%   BMI 32.49 kg/m  GEN: NAD, alert, cooperative, and pleasant. EYE: no conjunctival injection, pupils equally round and reactive to light ENMT: normal tympanic light reflex, no nasal polyps,no rhinorrhea, no pharyngeal erythema or exudates NECK: full ROM, no thyromegaly RESPIRATORY: clear to auscultation bilaterally with no wheezes, rhonchi or rales, good effort CV: RRR, no m/r/g, no peripheral edema GI: soft, non-tender, non-distended, no hepatosplenomegaly SKIN: warm and dry, no rashes or lesions NEURO: II-XII grossly intact PSYCH: AAOx3, appropriate affect  Assessment and plan:  Healthcare maintenance - patient refuses flu vaccine - he is unsure if due for TDAP but signs release forms for previous PCP records - screening BMP (for elev glucose, electrolytes, renal function) and screening lipid panel today   Orders Placed This Encounter  Procedures  . Basic metabolic panel    Order Specific Question:   Has the patient fasted?    Answer:   No  . Lipid panel    Order Specific Question:   Has the patient fasted?    Answer:   No    Arthur PouchLauren Gigi Onstad, MD,MS,  PGY2 06/13/2017 11:29 AM

## 2017-06-08 LAB — BASIC METABOLIC PANEL
BUN / CREAT RATIO: 10 (ref 9–20)
BUN: 13 mg/dL (ref 6–24)
CHLORIDE: 99 mmol/L (ref 96–106)
CO2: 26 mmol/L (ref 20–29)
Calcium: 9.2 mg/dL (ref 8.7–10.2)
Creatinine, Ser: 1.28 mg/dL — ABNORMAL HIGH (ref 0.76–1.27)
GFR calc Af Amer: 80 mL/min/{1.73_m2} (ref >59)
GFR calc non Af Amer: 70 mL/min/{1.73_m2} (ref >59)
GLUCOSE: 81 mg/dL (ref 65–99)
POTASSIUM: 4.1 mmol/L (ref 3.5–5.2)
SODIUM: 139 mmol/L (ref 134–144)

## 2017-06-08 LAB — LIPID PANEL
CHOLESTEROL TOTAL: 234 mg/dL — AB (ref 100–199)
Chol/HDL Ratio: 5.9 ratio — ABNORMAL HIGH (ref 0.0–5.0)
HDL: 40 mg/dL (ref >39)
LDL Calculated: 158 mg/dL — ABNORMAL HIGH (ref 0–99)
Triglycerides: 180 mg/dL — ABNORMAL HIGH (ref 0–149)
VLDL Cholesterol Cal: 36 mg/dL (ref 5–40)

## 2017-06-09 DIAGNOSIS — Z0000 Encounter for general adult medical examination without abnormal findings: Secondary | ICD-10-CM | POA: Insufficient documentation

## 2017-06-09 NOTE — Assessment & Plan Note (Signed)
-   patient refuses flu vaccine - he is unsure if due for TDAP but signs release forms for previous PCP records - screening BMP (for elev glucose, electrolytes, renal function) and screening lipid panel today

## 2017-06-13 ENCOUNTER — Encounter: Payer: Self-pay | Admitting: Student in an Organized Health Care Education/Training Program

## 2017-06-14 ENCOUNTER — Encounter: Payer: Self-pay | Admitting: Student in an Organized Health Care Education/Training Program

## 2018-01-23 ENCOUNTER — Other Ambulatory Visit: Payer: Self-pay

## 2018-01-23 ENCOUNTER — Encounter (HOSPITAL_COMMUNITY): Payer: Self-pay | Admitting: Emergency Medicine

## 2018-01-23 ENCOUNTER — Ambulatory Visit (HOSPITAL_COMMUNITY)
Admission: EM | Admit: 2018-01-23 | Discharge: 2018-01-23 | Disposition: A | Discharge status: Home / Self Care | Payer: BLUE CROSS/BLUE SHIELD | Attending: Family Medicine | Admitting: Family Medicine

## 2018-01-23 DIAGNOSIS — B001 Herpesviral vesicular dermatitis: Secondary | ICD-10-CM

## 2018-01-23 DIAGNOSIS — Z8619 Personal history of other infectious and parasitic diseases: Secondary | ICD-10-CM

## 2018-01-23 MED ORDER — MUPIROCIN 2 % EX OINT: 1.0000 "application " | TOPICAL_OINTMENT | Freq: Three times a day (TID) | CUTANEOUS | 1 refills | Status: AC

## 2018-01-23 MED ORDER — ACYCLOVIR 400 MG PO TABS: 400.0000 mg | ORAL_TABLET | Freq: Two times a day (BID) | ORAL | 11 refills | Status: DC

## 2018-01-23 NOTE — ED Triage Notes (Signed)
Pt reports a cold sore to his right lower lip/face for the last three days.  Pt reports that this is recurrent since he was 42 years old.

## 2018-01-23 NOTE — ED Provider Notes (Signed)
[facility]    CSN: [id number] Arrival date & time: 01/23/18  1606     History   Chief Complaint Chief Complaint  Patient presents with  . Mouth Lesions    HPI Arthur Gallagher is a 42 y.o. male.   Pt reports a cold sore to his right lower lip/face for the last three days.  Pt reports that this is recurrent since he was 42 years old.  The area in question is always in the same place and he has some tingling in the skin a day or 2 before it starts.  Usually last a week or 2.  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     Past Medical History:  Diagnosis Date  . Obesity   . Ptosis, congenital     Patient Active Problem List   Diagnosis Date Noted  . Healthcare maintenance 06/09/2017    History reviewed. No pertinent surgical history.     Home Medications    Prior to Admission medications   Medication Sig Start Date End Date Taking? Authorizing Provider  acyclovir (ZOVIRAX) 400 MG tablet Take 1 tablet (400 mg total) by mouth 2 (two) times daily. 01/23/18   Elvina Sidle, MD  mupirocin ointment (BACTROBAN) 2 % Apply 1 application topically 3 (three) times daily. 01/23/18   Elvina Sidle, MD    Family History Family History  Problem Relation Age of Onset  . Diabetes Mother     Social History Social History   Tobacco Use  . Smoking status: Never Smoker  . Smokeless tobacco: Never Used  Substance Use Topics  . Alcohol use: No  . Drug use: No     Allergies   Patient has no known allergies.   Review of Systems Review of Systems  Skin: Positive for rash.     Physical Exam Triage Vital Signs ED Triage Vitals [01/23/18 1639]  Enc Vitals Group     BP 118/73     Pulse Rate 70     Resp      Temp 98 F (36.7 C)     Temp Source Oral     SpO2 98 %     Weight      Height      Head Circumference      Peak Flow      Pain Score 0     Pain Loc       Pain Edu?      Excl. in GC?    No data found.  Updated Vital Signs BP 118/73 (BP Location: Left Arm)   Pulse 70   Temp 98 F (36.7 C) (Oral)   SpO2 98%    Physical Exam  Constitutional: He appears well-developed and well-nourished.  HENT:  Crusting cluster of vesicles on the right lower lip sparing the corner of the mouth.  Patient has terrible dentition with multiple cavities.  Eyes: Conjunctivae are normal.  Neck: Normal range of motion. Neck supple.  Pulmonary/Chest: Effort normal.  Musculoskeletal: Normal range of motion.  Neurological: He is alert.  Skin: Skin is warm and dry.  Requesting vesicles right lower lip  Nursing note and vitals reviewed.    UC Treatments / Results  Labs (all labs ordered are listed, but only abnormal results are displayed) Labs Reviewed - No data to display  EKG None  Radiology No results found.  Procedures Procedures (including critical  care time)  Medications Ordered in UC Medications - No data to display  Initial Impression / Assessment and Plan / UC Course  I have reviewed the triage vital signs and the nursing notes.  Pertinent labs & imaging results that were available during my care of the patient were reviewed by me and considered in my medical decision making (see chart for details).     Final Clinical Impressions(s) / UC Diagnoses   Final diagnoses:  History of cold sores   Discharge Instructions   None    ED Prescriptions    Medication Sig Dispense Auth. Provider   acyclovir (ZOVIRAX) 400 MG tablet Take 1 tablet (400 mg total) by mouth 2 (two) times daily. 60 tablet Elvina Sidle, MD   mupirocin ointment (BACTROBAN) 2 % Apply 1 application topically 3 (three) times daily. 22 g Elvina Sidle, MD     Controlled Substance Prescriptions [place] Controlled Substance Registry consulted? Not Applicable   Elvina Sidle, MD 01/23/18 [phone]

## 2018-02-21 ENCOUNTER — Encounter: Payer: BLUE CROSS/BLUE SHIELD | Admitting: Student in an Organized Health Care Education/Training Program

## 2018-06-28 ENCOUNTER — Emergency Department (HOSPITAL_COMMUNITY): Payer: BLUE CROSS/BLUE SHIELD

## 2018-06-28 ENCOUNTER — Other Ambulatory Visit: Payer: Self-pay

## 2018-06-28 ENCOUNTER — Encounter (HOSPITAL_COMMUNITY): Payer: Self-pay | Admitting: Emergency Medicine

## 2018-06-28 ENCOUNTER — Emergency Department (HOSPITAL_COMMUNITY)
Admission: EM | Admit: 2018-06-28 | Discharge: 2018-06-28 | Disposition: A | Discharge status: Home / Self Care | Payer: BLUE CROSS/BLUE SHIELD | Attending: Emergency Medicine | Admitting: Emergency Medicine

## 2018-06-28 DIAGNOSIS — M25572 Pain in left ankle and joints of left foot: Secondary | ICD-10-CM

## 2018-06-28 DIAGNOSIS — S99922A Unspecified injury of left foot, initial encounter: Secondary | ICD-10-CM | POA: Diagnosis not present

## 2018-06-28 DIAGNOSIS — Z79899 Other long term (current) drug therapy: Secondary | ICD-10-CM | POA: Insufficient documentation

## 2018-06-28 DIAGNOSIS — M79672 Pain in left foot: Secondary | ICD-10-CM | POA: Diagnosis not present

## 2018-06-28 DIAGNOSIS — S8992XA Unspecified injury of left lower leg, initial encounter: Secondary | ICD-10-CM | POA: Diagnosis not present

## 2018-06-28 DIAGNOSIS — S99912A Unspecified injury of left ankle, initial encounter: Secondary | ICD-10-CM | POA: Diagnosis not present

## 2018-06-28 MED ORDER — IBUPROFEN 400 MG PO TABS
600.0000 mg | ORAL_TABLET | Freq: Once | ORAL | Status: AC
  Administered 2018-06-28: 600 mg via ORAL
  Filled 2018-06-28: qty 1

## 2018-06-28 MED ORDER — NAPROXEN 500 MG PO TABS: 500.0000 mg | ORAL_TABLET | Freq: Two times a day (BID) | ORAL | 0 refills | Status: AC

## 2018-06-28 NOTE — ED Triage Notes (Signed)
Pt. Stated, I had something that fell on my foot 2 weeks ago and this morning it was swollen.

## 2018-06-28 NOTE — ED Notes (Signed)
Patient transported to X-ray 

## 2018-06-28 NOTE — Discharge Instructions (Addendum)
You have been seen today for left ankle and foot pain. Please read and follow all provided instructions.   1. Medications: naproxen for pain, usual home medications 2. Treatment: rest, drink plenty of fluids 3. Follow Up: Please follow up with your primary doctor in 2-5 days for discussion of your diagnoses and further evaluation after today's visit; if you do not have a primary care doctor use the resource guide provided to find one; Please return to the ER for any new or worsening symptoms. Please obtain all of your results from medical records or have your doctors office obtain the results - share them with your doctor - you should be seen at your doctors office. Call today to arrange your follow up.   Take medications as prescribed. Please review all of the medicines and only take them if you do not have an allergy to them. Return to the emergency room for worsening condition or new concerning symptoms. Follow up with your regular doctor. If you don't have a regular doctor use one of the numbers below to establish a primary care doctor.  Please be aware that if you are taking birth control pills, taking other prescriptions, ESPECIALLY ANTIBIOTICS may make the birth control ineffective - if this is the case, either do not engage in sexual activity or use alternative methods of birth control such as condoms until you have finished the medicine and your family doctor says it is OK to restart them. If you are on a blood thinner such as COUMADIN, be aware that any other medicine that you take may cause the coumadin to either work too much, or not enough - you should have your coumadin level rechecked in next 7 days if this is the case.  ?  It is also a possibility that you have an allergic reaction to any of the medicines that you have been prescribed - Everybody reacts differently to medications and while MOST people have no trouble with most medicines, you may have a reaction such as nausea, vomiting,  rash, swelling, shortness of breath. If this is the case, please stop taking the medicine immediately and contact your physician.  ?  You should return to the ER if you develop severe or worsening symptoms.   Emergency Department Resource Guide 1) Find a Doctor and Pay Out of Pocket Although you won't have to find out who is covered by your insurance plan, it is a good idea to ask around and get recommendations. You will then need to call the office and see if the doctor you have chosen will accept you as a new patient and what types of options they offer for patients who are self-pay. Some doctors offer discounts or will set up payment plans for their patients who do not have insurance, but you will need to ask so you aren't surprised when you get to your appointment.  2) Contact Your Local Health Department Not all health departments have doctors that can see patients for sick visits, but many do, so it is worth a call to see if yours does. If you don't know where your local health department is, you can check in your phone book. The CDC also has a tool to help you locate your state's health department, and many state websites also have listings of all of their local health departments.  3) Find a Walk-in Clinic If your illness is not likely to be very severe or complicated, you may want to try a walk in clinic.  These are popping up all over the country in pharmacies, drugstores, and shopping centers. They're usually staffed by nurse practitioners or physician assistants that have been trained to treat common illnesses and complaints. They're usually fairly quick and inexpensive. However, if you have serious medical issues or chronic medical problems, these are probably not your best option.  No Primary Care Doctor: Call Health Connect at  [phone] - they can help you locate a primary care doctor that  accepts your insurance, provides certain services, etc. Physician Referral [phone]  Emergency Department Resource Guide 1) Find a Doctor and Pay Out of Pocket Although you won't have to find out who is covered by your insurance plan, it is a good idea to ask around and get recommendations. You will then need to call the office and see if the doctor you have chosen will accept you as a new patient and what types of options they offer for patients who are self-pay. Some doctors offer discounts or will set up payment plans for their patients who do not have insurance, but you will need to ask so you aren't surprised when you get to your appointment.  2) Contact Your Local Health Department Not all health departments have doctors that can see patients for sick visits, but many do, so it is worth a call to see if yours does. If you don't know where your local health department is, you can check in your phone book. The CDC also has a tool to help you locate your state's health department, and many state websites also have listings of all of their local health departments.  3) Find a [facility] If your illness is not likely to be very severe or complicated, you may want to try a walk in clinic. These are popping up all over the country in pharmacies, drugstores, and shopping centers. They're usually staffed by nurse practitioners or physician assistants that have been trained to treat common illnesses and complaints. They're usually fairly quick and inexpensive. However, if you have serious medical issues or chronic medical problems, these are probably not your best option.  No Primary Care Doctor: Call Health Connect at  [phone] - they can help you locate a primary care doctor that  accepts your insurance, provides certain services, etc. Physician Referral Service- [phone]  Chronic Pain Problems: Organization         Address  Phone   Notes  [facility]  [phone] Patients need to be referred by their primary care doctor.    Medication Assistance: Organization         Address  Phone   Notes  [facility] Medication [facility] [address] [place], [place] [address] [phone] --Must be a resident of [facility] -- Must have NO insurance coverage whatsoever (no Medicaid/ Medicare, etc.) -- The pt. MUST have a primary care doctor that directs their care regularly and follows them in the community   [facility]  [phone]   [employer]  [phone]    Agencies that provide inexpensive medical care: Organization         Address  Phone   Notes  [place]  [phone]   [facility]    [phone]   [facility] [place], [place] [address] [phone]   [place] [address], [place] [phone]   [facility]anned Parenthood    (  [phone]   [facility]    [phone]   [facility]  [address], [place] Phone:  [phone], Fax:  [phone] Hours of Operation:  9 am - 6 pm, M-F.  Also accepts Medicaid/Medicare and self-pay.  [facility] for [place] [address], [address], [place] Phone: [phone], Fax: [phone]. Hours of Operation:  8:30 am - 5:30 pm, M-F.  Also accepts Medicaid and self-pay.  [facility] [place] [address], [place] Phone: [phone]   [place], [place], [place] [phone], Ext. [phone] Mondays & Thursdays: 7-9 AM.  First 15 patients are seen on a first come, first serve basis.    [place] Providers:  Organization         Address  Phone   Notes  [facility] [address], [place] [phone] Also accepts self-pay patients.  [facility] [address], [place]  [phone]   [place], [address], [place] [phone]   [facility] [address] [place] [phone]   Lucianne Lei [address], [place]   [phone] Only accepts [place] [facility] [place] patients after they have their name applied to their card.   Self-Pay (no insurance) in [facility]:  Organization         Address  Phone   Notes  Sickle Cell Patients, [facility] [place] [phone]   [facility] [place] [phone]   [facility] [place]  [place], [place], [place] [phone]   [facility]/Dr. Osei-Bonsu  [address], [place] or [address], [place] [phone] Phone number for both [place] and [place] locations is the same.  [facility] [address], [place] [phone]   [facility] [address] [place] or [address] [phone]   [facility] [address] [place] [phone], phone; [phone], fax Sees patients 1st and 3rd Saturday of every month.  Must not qualify for public or private insurance (i.e. Medicaid, Medicare, [place] Health Choice, Veterans' Benefits)  Household income should be no more than 200% of the poverty level The clinic cannot treat you if you are pregnant or think you are pregnant  Sexually transmitted diseases are not treated at the clinic.

## 2018-06-28 NOTE — ED Notes (Signed)
Ice pack provided to pt

## 2018-06-28 NOTE — ED Provider Notes (Signed)
[facility] EMERGENCY DEPARTMENT Provider Note   CSN: [id number] Arrival date & time: 06/28/18  0900    History   Chief Complaint Chief Complaint  Patient presents with  . Ankle Pain    HPI Arthur Gallagher is a 43 y.o. male with a PMH of congenital ptosis and obesity presenting with constant left ankle pain and edema onset last night. Patient describes pain as an ache. Patient reports he dropped a 100 lbs piece of steel on his left foot while he was at work at a [employer] about 2 weeks ago. Patient reports he was wearing protective boots and only felt slight pain at the time. Patient reports denies taking any medications for his pain. Patient states movement makes pain worse and rest makes pain better. Patient denies any other injuries. Patient denies fever, chills, nausea, vomiting, congestion, cough, erythema, numbness, or weakness. Patient states he is able to walk and drove himself to the ER today.    HPI  Past Medical History:  Diagnosis Date  . Obesity   . Ptosis, congenital     Patient Active Problem List   Diagnosis Date Noted  . Healthcare maintenance 06/09/2017    History reviewed. No pertinent surgical history.      Home Medications    Prior to Admission medications   Medication Sig Start Date End Date Taking? Authorizing Provider  acyclovir (ZOVIRAX) 400 MG tablet Take 1 tablet (400 mg total) by mouth 2 (two) times daily. 01/23/18   Elvina Sidle, MD  mupirocin ointment (BACTROBAN) 2 % Apply 1 application topically 3 (three) times daily. 01/23/18   Elvina Sidle, MD  naproxen (NAPROSYN) 500 MG tablet Take 1 tablet (500 mg total) by mouth 2 (two) times daily. 06/28/18   Leretha Dykes, PA-C    Family History Family History  Problem Relation Age of Onset  . Diabetes Mother     Social History Social History   Tobacco Use  . Smoking status: Never Smoker  . Smokeless tobacco: Never Used  Substance Use Topics  . Alcohol  use: No  . Drug use: No     Allergies   Patient has no known allergies.   Review of Systems Review of Systems  Constitutional: Negative for activity change, chills, diaphoresis and fever.  HENT: Negative for congestion and rhinorrhea.   Eyes: Negative for pain, discharge and redness.  Respiratory: Negative for shortness of breath.   Cardiovascular: Negative for leg swelling.  Gastrointestinal: Negative for abdominal pain, diarrhea, nausea and vomiting.  Genitourinary: Negative for dysuria and genital sores.  Musculoskeletal: Positive for arthralgias and joint swelling. Negative for back pain, gait problem and myalgias.  Skin: Negative for color change, rash and wound.  Allergic/Immunologic: Negative for immunocompromised state.  Neurological: Negative for weakness and numbness.  Hematological: Does not bruise/bleed easily.   Physical Exam Updated Vital Signs BP 129/77 (BP Location: Right Arm)   Pulse 87   Temp 97.7 F (36.5 C) (Oral)   Resp 16   Ht 5\' 9"  (1.753 m)   Wt 94.3 kg   SpO2 98%   BMI 30.72 kg/m   Physical Exam Vitals signs and nursing note reviewed.  Constitutional:      General: He is not in acute distress.    Appearance: He is well-developed. He is not diaphoretic.  HENT:     Head: Normocephalic and atraumatic.  Cardiovascular:     Rate and Rhythm: Normal rate and regular rhythm.     Heart sounds:  Normal heart sounds. No murmur. No friction rub. No gallop.   Pulmonary:     Effort: Pulmonary effort is normal. No respiratory distress.     Breath sounds: Normal breath sounds. No wheezing or rales.  Abdominal:     Palpations: Abdomen is soft.     Tenderness: There is no abdominal tenderness.  Musculoskeletal:     Right knee: Normal. He exhibits normal range of motion, no swelling and no effusion.     Left knee: Normal. He exhibits normal range of motion, no swelling, no effusion and no ecchymosis.     Right ankle: Normal. He exhibits normal range of  motion, no swelling and no ecchymosis.     Left ankle: He exhibits decreased range of motion and swelling. He exhibits no ecchymosis, no deformity, no laceration and normal pulse. Tenderness. Medial malleolus tenderness found. No lateral malleolus, no AITFL, no CF ligament, no posterior TFL, no head of 5th metatarsal and no proximal fibula tenderness found. Achilles tendon exhibits no pain and no defect.     Right foot: Normal. Normal range of motion. No tenderness or bony tenderness.     Left foot: Decreased range of motion. Tenderness, bony tenderness and swelling present.     Comments: Mild edema noted over the dorsal aspect of left foot and left ankle. Tenderness to palpation over the dorsal aspect of left foot and medial malleolus of left ankle. Mild decreased ROM of left foot and ankle due to discomfort. Mild tenderness over left tibia. 2+ DP pulses. Sensation intact. Patient is able to ambulate with minimal discomfort.   Skin:    Findings: No erythema or rash.  Neurological:     Mental Status: He is alert.      ED Treatments / Results  Labs (all labs ordered are listed, but only abnormal results are displayed) Labs Reviewed - No data to display  EKG None  Radiology Dg Tibia/fibula Left  Result Date: 06/28/2018 CLINICAL DATA:  Crush injury at work today. EXAM: LEFT TIBIA AND FIBULA - 2 VIEW COMPARISON:  None. FINDINGS: There is no evidence of fracture or other focal bone lesions. Soft tissues are unremarkable. IMPRESSION: Negative. Electronically Signed   By: Paulina Fusi M.D.   On: 06/28/2018 10:12   Dg Ankle Complete Left  Result Date: 06/28/2018 CLINICAL DATA:  Crush injury at work today. EXAM: LEFT ANKLE COMPLETE - 3+ VIEW COMPARISON:  None. FINDINGS: There is no evidence of fracture, dislocation, or joint effusion. There is no evidence of arthropathy or other focal bone abnormality. Soft tissues are unremarkable. IMPRESSION: Negative. Electronically Signed   By: Paulina Fusi  M.D.   On: 06/28/2018 10:11   Dg Foot Complete Left  Result Date: 06/28/2018 CLINICAL DATA:  Crush injury at work today. EXAM: LEFT FOOT - COMPLETE 3+ VIEW COMPARISON:  None. FINDINGS: There is no evidence of fracture or dislocation. There is no evidence of arthropathy or other focal bone abnormality. Nonspecific dorsal soft tissue swelling overlying the midfoot. IMPRESSION: No evidence of fracture or dislocation. Dorsal soft tissue swelling. Electronically Signed   By: Paulina Fusi M.D.   On: 06/28/2018 10:11    Procedures Procedures (including critical care time)  Medications Ordered in ED Medications  ibuprofen (ADVIL,MOTRIN) tablet 600 mg (600 mg Oral Given 06/28/18 0957)     Initial Impression / Assessment and Plan / ED Course  I have reviewed the triage vital signs and the nursing notes.  Pertinent labs & imaging results that were available during  my care of the patient were reviewed by me and considered in my medical decision making (see chart for details).  Clinical Course as of Jun 28 1018  Sat Jun 28, 2018  1014 No evidence of fracture or dislocation. Dorsal soft tissue swelling.  DG Foot Complete Left [AH]  1014 Ankle and Tibia/Fibula x rays are negative for a fracture or dislocation.  DG Ankle Complete Left [AH]    Clinical Course User Index [AH] Leretha Dykes, PA-C      Patient X-Ray negative for obvious fracture or dislocation. Pain managed in ED. Pt advised to follow up with orthopedics if symptoms persist for possibility of missed fracture diagnosis. Patient given brace while in ED, conservative therapy recommended and discussed. Discussed return precautions. Patient will be dc home & is agreeable with above plan.  Final Clinical Impressions(s) / ED Diagnoses   Final diagnoses:  Acute left ankle pain  Left foot pain    ED Discharge Orders         Ordered    naproxen (NAPROSYN) 500 MG tablet  2 times daily     06/28/18 1020           Carlyle Basques  [place], [place] 06/28/18 1021    Terrilee Files, MD 06/28/18 1727

## 2018-11-20 ENCOUNTER — Other Ambulatory Visit: Payer: Self-pay

## 2018-11-20 ENCOUNTER — Ambulatory Visit (HOSPITAL_COMMUNITY)
Admission: EM | Admit: 2018-11-20 | Discharge: 2018-11-20 | Disposition: A | Discharge status: Home / Self Care | Payer: BLUE CROSS/BLUE SHIELD | Attending: Emergency Medicine | Admitting: Emergency Medicine

## 2018-11-20 ENCOUNTER — Encounter (HOSPITAL_COMMUNITY): Payer: Self-pay

## 2018-11-20 DIAGNOSIS — K047 Periapical abscess without sinus: Secondary | ICD-10-CM | POA: Diagnosis not present

## 2018-11-20 DIAGNOSIS — K029 Dental caries, unspecified: Secondary | ICD-10-CM

## 2018-11-20 MED ORDER — CHLORHEXIDINE GLUCONATE 0.12% ORAL RINSE (MEDLINE KIT): 15.0000 mL | Freq: Two times a day (BID) | OROMUCOSAL | 0 refills | Status: AC

## 2018-11-20 MED ORDER — AMOXICILLIN-POT CLAVULANATE 875-125 MG PO TABS: 1.0000 | ORAL_TABLET | Freq: Two times a day (BID) | ORAL | 0 refills | Status: AC

## 2018-11-20 NOTE — Discharge Instructions (Signed)
°  Please follow up with your dentist as previously scheduled. It is recommended you let them know which medications you were prescribed today so they can be better prepared for your visit.

## 2018-11-20 NOTE — ED Triage Notes (Signed)
Pt states he has a dental pain. Pt states she has abscess.

## 2018-11-20 NOTE — ED Provider Notes (Signed)
[facility]    CSN: [id number] Arrival date & time: 11/20/18  1517     History   Chief Complaint Chief Complaint  Patient presents with  . Dental Pain  . Abscess    HPI Mauri A Harroun is a 43 y.o. male.   HPI  Kam A Gilbert is a 43 y.o. male presenting to UC with c/o diffuse dental pain, worse in lower front jaw. He has an appointment with his dentist in about 3 weeks but was advised he should be on antibiotics first.  He believes his gums drain some at night. He has had bad teeth for many years and will be getting dentures soon. Denies fever, chills, n/v/d.    Past Medical History:  Diagnosis Date  . Obesity   . Ptosis, congenital     Patient Active Problem List   Diagnosis Date Noted  . Healthcare maintenance 06/09/2017    History reviewed. No pertinent surgical history.     Home Medications    Prior to Admission medications   Medication Sig Start Date End Date Taking? Authorizing Provider  acyclovir (ZOVIRAX) 400 MG tablet Take 1 tablet (400 mg total) by mouth 2 (two) times daily. 01/23/18   Lauenstein, Kurt, MD  amoxicillin-clavulanate (AUGMENTIN) 875-125 MG tablet Take 1 tablet by mouth 2 (two) times daily. One po bid x 7 days 11/20/18   Phelps, Erin O, PA-C  chlorhexidine gluconate, MEDLINE KIT, (PERIDEX) 0.12 % solution Use as directed 15 mLs in the mouth or throat 2 (two) times daily. 11/20/18   Phelps, Erin O, PA-C  mupirocin ointment (BACTROBAN) 2 % Apply 1 application topically 3 (three) times daily. 01/23/18   Lauenstein, Kurt, MD  naproxen (NAPROSYN) 500 MG tablet Take 1 tablet (500 mg total) by mouth 2 (two) times daily. 06/28/18   Hernandez, Ana P, PA-C    Family History Family History  Problem Relation Age of Onset  . Diabetes Mother     Social History Social History   Tobacco Use  . Smoking status: Never Smoker  . Smokeless tobacco: Never Used  Substance Use Topics  . Alcohol use: No  . Drug use: No     Allergies    Patient has no known allergies.   Review of Systems Review of Systems  Constitutional: Negative for chills and fever.  HENT: Positive for dental problem. Negative for sore throat.   Gastrointestinal: Negative for nausea and vomiting.     Physical Exam Triage Vital Signs ED Triage Vitals  Enc Vitals Group     BP 11/20/18 1534 114/68     Pulse Rate 11/20/18 1534 76     Resp 11/20/18 1534 18     Temp 11/20/18 1534 98.4 F (36.9 C)     Temp src --      SpO2 11/20/18 1534 100 %     Weight 11/20/18 1532 220 lb (99.8 kg)     Height --      Head Circumference --      Peak Flow --      Pain Score 11/20/18 1532 7     Pain Loc --      Pain Edu? --      Excl. in GC? --    No data found.  Updated Vital Signs BP 114/68 (BP Location: Right Arm)   Pulse 76   Temp 98.4 F (36.9 C)   Resp 18   Wt 220 lb (99.8 kg)   SpO2 100%   BMI 32.49 kg/m     Visual Acuity Right Eye Distance:   Left Eye Distance:   Bilateral Distance:    Right Eye Near:   Left Eye Near:    Bilateral Near:     Physical Exam Vitals signs and nursing note reviewed.  Constitutional:      Appearance: Normal appearance. He is well-developed.  HENT:     Head: Normocephalic and atraumatic.     Mouth/Throat:     Lips: Pink.     Mouth: Mucous membranes are moist.     Dentition: Abnormal dentition. Dental tenderness, gingival swelling, dental caries and dental abscesses present.     Pharynx: Oropharynx is clear. Uvula midline.     Comments: Diffuse dental decay and gingival edema, worst in front lower jaw line.  Neck:     Musculoskeletal: Normal range of motion.  Cardiovascular:     Rate and Rhythm: Normal rate.  Pulmonary:     Effort: Pulmonary effort is normal.  Musculoskeletal: Normal range of motion.  Skin:    General: Skin is warm and dry.  Neurological:     Mental Status: He is alert and oriented to person, place, and time.  Psychiatric:        Behavior: Behavior normal.      UC Treatments  / Results  Labs (all labs ordered are listed, but only abnormal results are displayed) Labs Reviewed - No data to display  EKG   Radiology No results found.  Procedures Procedures (including critical care time)  Medications Ordered in UC Medications - No data to display  Initial Impression / Assessment and Plan / UC Course  I have reviewed the triage vital signs and the nursing notes.  Pertinent labs & imaging results that were available during my care of the patient were reviewed by me and considered in my medical decision making (see chart for details).     Will start pt on antibiotics. Encouraged to f/u with his dentist as previously scheduled. AVS provided.  Final Clinical Impressions(s) / UC Diagnoses   Final diagnoses:  Dental abscess  Dental decay     Discharge Instructions      Please follow up with your dentist as previously scheduled. It is recommended you let them know which medications you were prescribed today so they can be better prepared for your visit.     ED Prescriptions    Medication Sig Dispense Auth. Provider   amoxicillin-clavulanate (AUGMENTIN) 875-125 MG tablet Take 1 tablet by mouth 2 (two) times daily. One po bid x 7 days 14 tablet Leeroy Cha O, PA-C   chlorhexidine gluconate, MEDLINE KIT, (PERIDEX) 0.12 % solution Use as directed 15 mLs in the mouth or throat 2 (two) times daily. 120 mL Noe Gens, PA-C     Controlled Substance Prescriptions [place] Controlled Substance Registry consulted? Not Applicable   Tyrell Antonio 11/20/18 1729

## 2019-01-13 ENCOUNTER — Other Ambulatory Visit: Payer: Self-pay | Admitting: Registered"

## 2019-01-13 DIAGNOSIS — Z20822 Contact with and (suspected) exposure to covid-19: Secondary | ICD-10-CM

## 2019-01-13 DIAGNOSIS — R6889 Other general symptoms and signs: Secondary | ICD-10-CM | POA: Diagnosis not present

## 2019-01-14 LAB — NOVEL CORONAVIRUS, NAA: SARS-CoV-2, NAA: NOT DETECTED

## 2019-04-15 ENCOUNTER — Ambulatory Visit: Payer: BC Managed Care – PPO | Attending: Internal Medicine

## 2019-04-15 DIAGNOSIS — Z20828 Contact with and (suspected) exposure to other viral communicable diseases: Secondary | ICD-10-CM | POA: Diagnosis not present

## 2019-04-15 DIAGNOSIS — Z20822 Contact with and (suspected) exposure to covid-19: Secondary | ICD-10-CM

## 2019-04-16 LAB — NOVEL CORONAVIRUS, NAA: SARS-CoV-2, NAA: DETECTED — AB

## 2019-04-29 ENCOUNTER — Ambulatory Visit: Payer: BC Managed Care – PPO | Attending: Internal Medicine

## 2019-04-29 DIAGNOSIS — Z20822 Contact with and (suspected) exposure to covid-19: Secondary | ICD-10-CM | POA: Diagnosis not present

## 2019-05-01 LAB — NOVEL CORONAVIRUS, NAA: SARS-CoV-2, NAA: NOT DETECTED

## 2019-06-08 ENCOUNTER — Ambulatory Visit (HOSPITAL_COMMUNITY)
Admission: EM | Admit: 2019-06-08 | Discharge: 2019-06-08 | Discharge status: Against Medical Advice | Payer: BC Managed Care – PPO

## 2019-06-08 ENCOUNTER — Encounter (HOSPITAL_COMMUNITY): Payer: Self-pay

## 2019-06-08 ENCOUNTER — Other Ambulatory Visit: Payer: Self-pay

## 2019-06-08 NOTE — ED Triage Notes (Signed)
Patient presents to Urgent Care with complaints of needing his acyclovir refilled since being out about a week. Pt is not having any active breakouts at this time.

## 2019-06-08 NOTE — ED Notes (Signed)
Pt left. 

## 2019-06-09 ENCOUNTER — Ambulatory Visit (HOSPITAL_COMMUNITY)
Admission: EM | Admit: 2019-06-09 | Discharge: 2019-06-09 | Disposition: A | Discharge status: Home / Self Care | Payer: BC Managed Care – PPO | Attending: Family Medicine | Admitting: Family Medicine

## 2019-06-09 ENCOUNTER — Encounter (HOSPITAL_COMMUNITY): Payer: Self-pay

## 2019-06-09 ENCOUNTER — Other Ambulatory Visit: Payer: Self-pay

## 2019-06-09 DIAGNOSIS — Z8619 Personal history of other infectious and parasitic diseases: Secondary | ICD-10-CM | POA: Diagnosis not present

## 2019-06-09 MED ORDER — ACYCLOVIR 400 MG PO TABS: 400.0000 mg | ORAL_TABLET | Freq: Two times a day (BID) | ORAL | 11 refills | Status: AC

## 2019-06-09 NOTE — ED Provider Notes (Signed)
  [facility]   [id number] 06/09/19 Arrival Time: 1533  ASSESSMENT & PLAN:  1. History of cold sores     Refilled below as requested. Prefers to continue taking this way and is pleased with much less frequent outbreaks.   Meds ordered this encounter  Medications  . acyclovir (ZOVIRAX) 400 MG tablet    Sig: Take 1 tablet (400 mg total) by mouth 2 (two) times daily.    Dispense:  60 tablet    Refill:  11    Reviewed expectations re: course of current medical issues. Questions answered. Outlined signs and symptoms indicating need for more acute intervention. Patient verbalized understanding. After Visit Summary given.   SUBJECTIVE:  Arthur Gallagher is a 44 y.o. male who reports a h/o frequent cold sores on lips. Has been taking acyclovir since being seen here in 2019. Reports much improvement. No medication side effects noted. Requests refill. Otherwise is well.   OBJECTIVE: Vitals:   06/09/19 1555  BP: 115/82  Pulse: 83  Resp: 16  Temp: 98.8 F (37.1 C)  TempSrc: Oral  SpO2: 99%    General appearance: alert; no distress HEENT: [place]; AT; no lip lesions Neck: supple with FROM Skin: warm and dry Psychological: alert and cooperative; normal mood and affect  No Known Allergies  Past Medical History:  Diagnosis Date  . Obesity   . Ptosis, congenital    Social History   Socioeconomic History  . Marital status: Married    Spouse name: Not on file  . Number of children: Not on file  . Years of education: Not on file  . Highest education level: Not on file  Occupational History  . Not on file  Tobacco Use  . Smoking status: Never Smoker  . Smokeless tobacco: Never Used  Substance and Sexual Activity  . Alcohol use: No  . Drug use: No  . Sexual activity: Not on file  Other Topics Concern  . Not on file  Social History Narrative  . Not on file   Social Determinants of Health   Financial Resource Strain:   . Difficulty of Paying Living  Expenses: Not on file  Food Insecurity:   . Worried About Programme researcher, broadcasting/film/video in the Last Year: Not on file  . Ran Out of Food in the Last Year: Not on file  Transportation Needs:   . Lack of Transportation (Medical): Not on file  . Lack of Transportation (Non-Medical): Not on file  Physical Activity:   . Days of Exercise per Week: Not on file  . Minutes of Exercise per Session: Not on file  Stress:   . Feeling of Stress : Not on file  Social Connections:   . Frequency of Communication with Friends and Family: Not on file  . Frequency of Social Gatherings with Friends and Family: Not on file  . Attends Religious Services: Not on file  . Active Member of Clubs or Organizations: Not on file  . Attends Banker Meetings: Not on file  . Marital Status: Not on file  Intimate Partner Violence:   . Fear of Current or Ex-Partner: Not on file  . Emotionally Abused: Not on file  . Physically Abused: Not on file  . Sexually Abused: Not on file   Family History  Problem Relation Age of Onset  . Diabetes Mother    History reviewed. No pertinent surgical history.   Mardella Layman, MD 06/09/19 [phone]

## 2019-06-09 NOTE — ED Triage Notes (Signed)
Patient here for acyclovir refill.

## 2020-01-07 IMAGING — CR LEFT FOOT - COMPLETE 3+ VIEW
3 series · 3 of 3 positions shown · non-contrast
Comparison: None.

CLINICAL DATA: Crush injury at work today.

EXAM:
LEFT FOOT - COMPLETE 3+ VIEW

[foot ap]
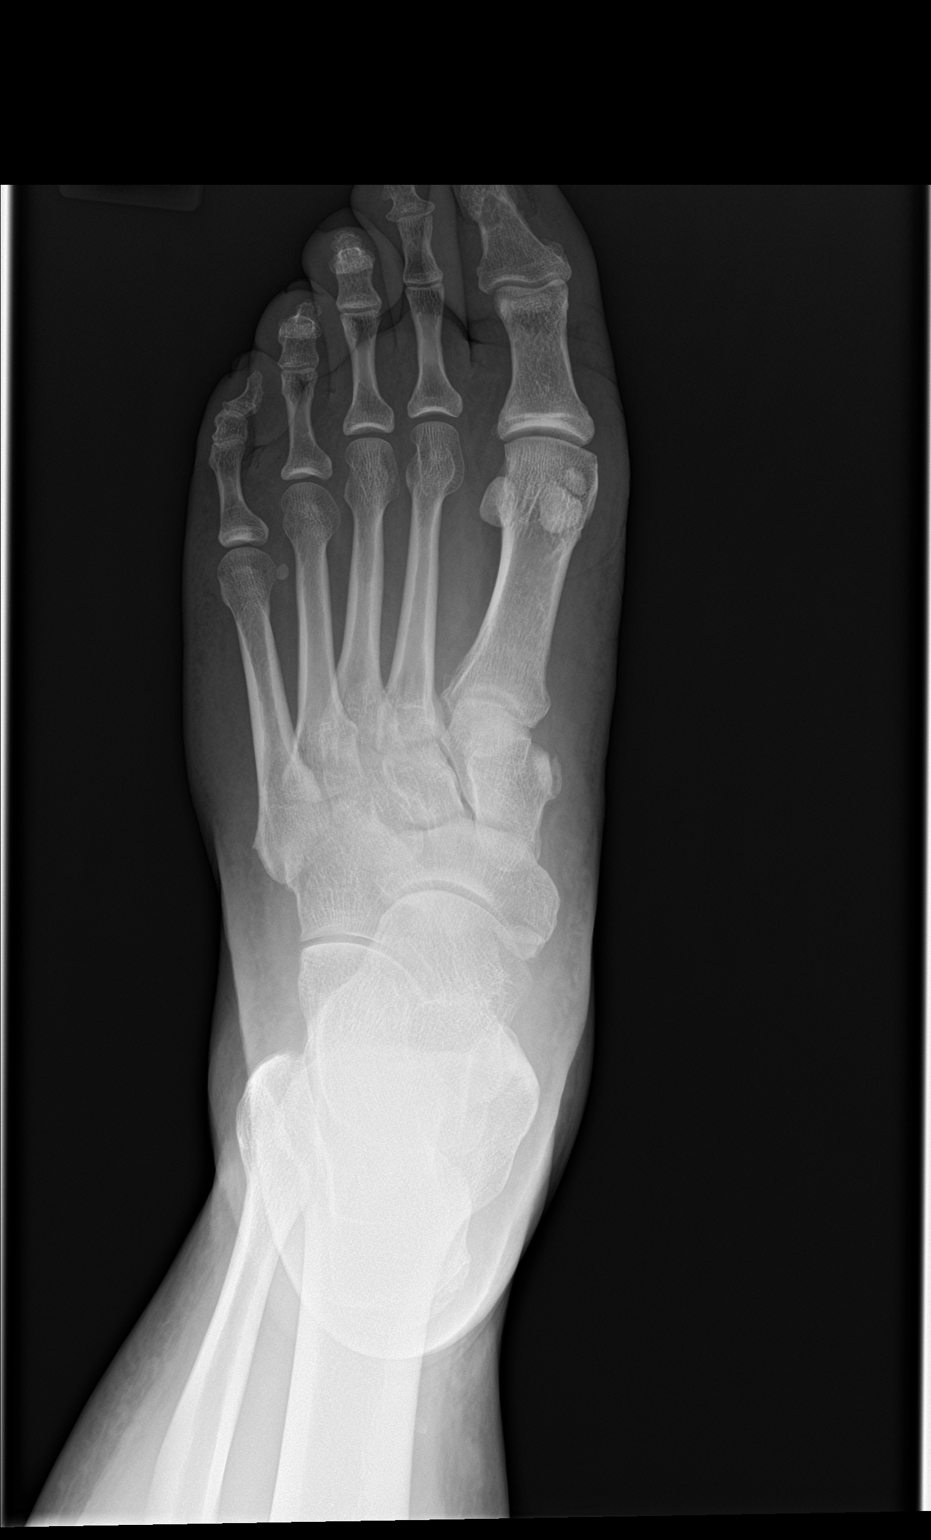

[foot obl]
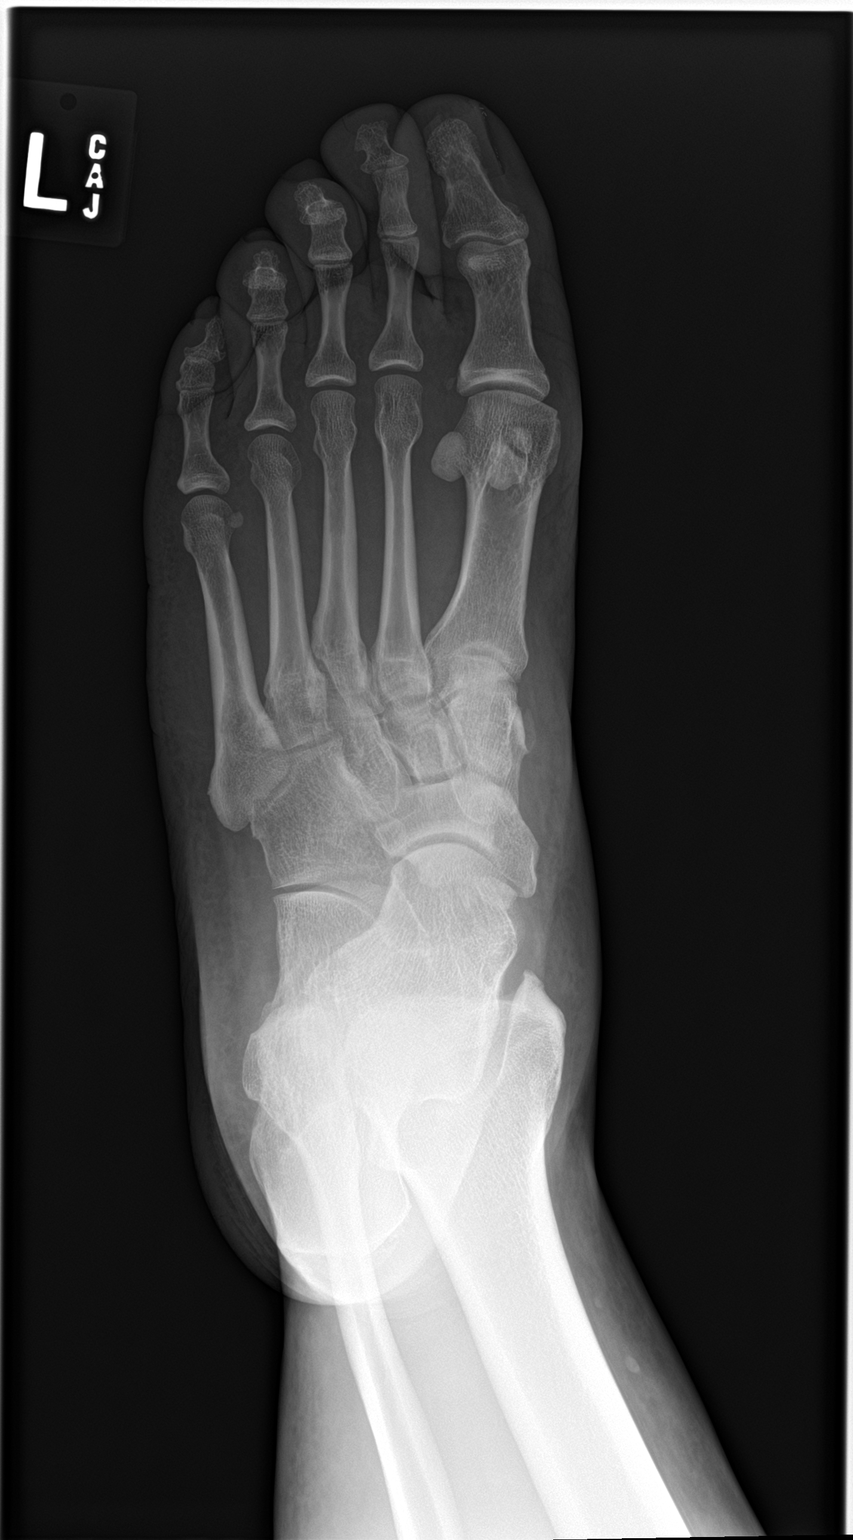

[foot lat]
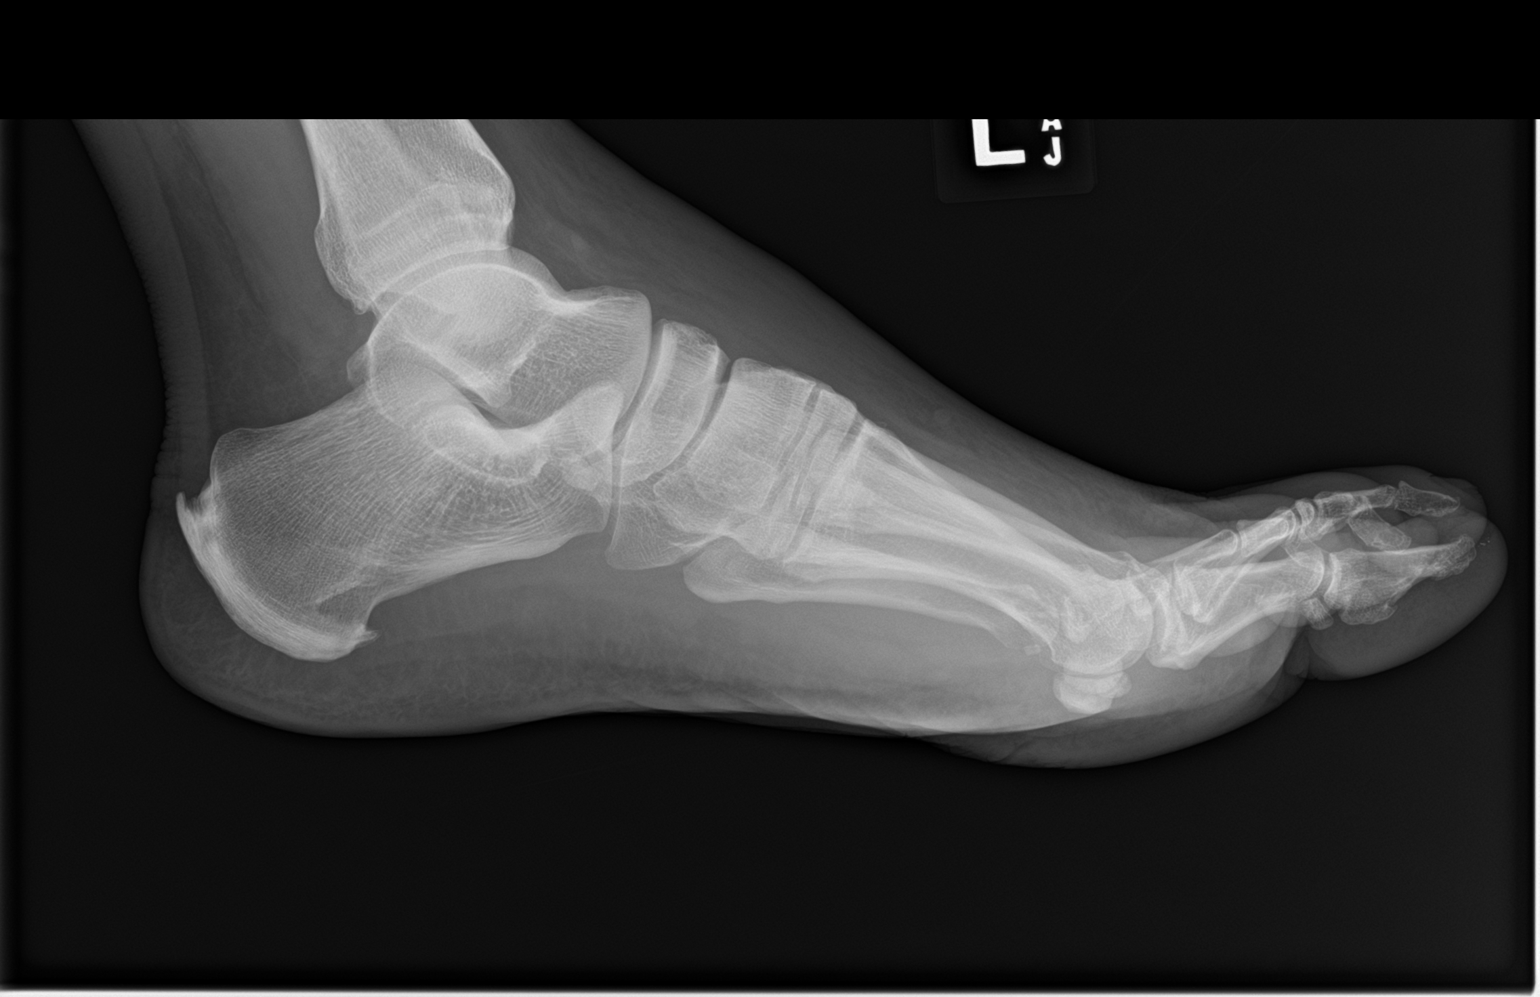

[3 of 3 positions shown; findings below may reference images not displayed]

FINDINGS: There is no evidence of fracture or dislocation. There is no
evidence of arthropathy or other focal bone abnormality. Nonspecific
dorsal soft tissue swelling overlying the midfoot.
IMPRESSION: No evidence of fracture or dislocation. Dorsal soft tissue swelling.

## 2020-01-07 IMAGING — CR LEFT ANKLE COMPLETE - 3+ VIEW
3 series · 3 of 3 positions shown · non-contrast
Comparison: None.

CLINICAL DATA: Crush injury at work today.

EXAM:
LEFT ANKLE COMPLETE - 3+ VIEW

[ankle ap]
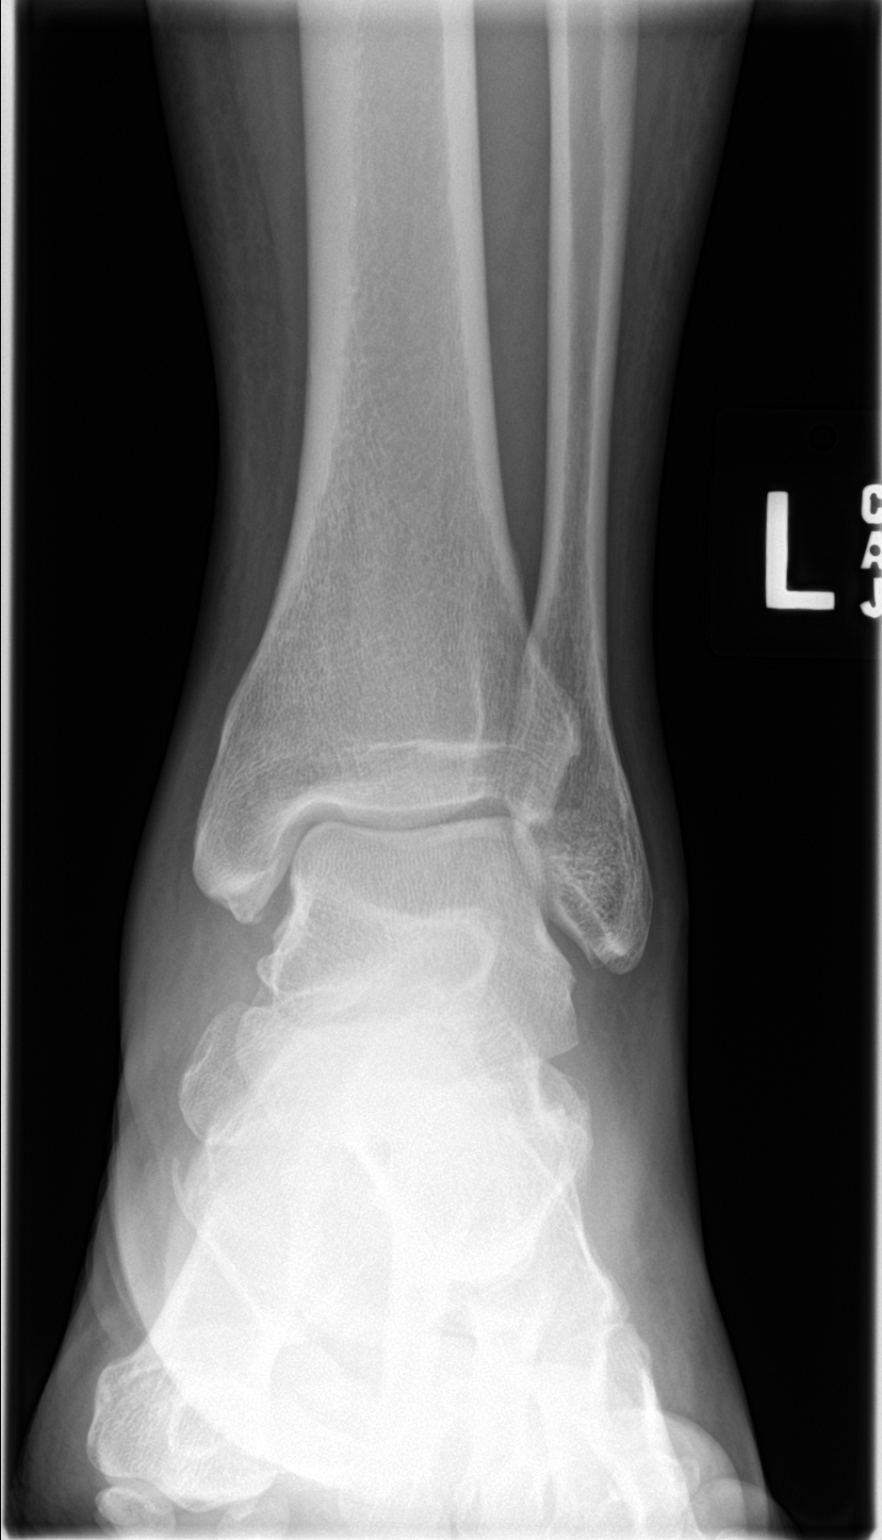

[ankle obl]
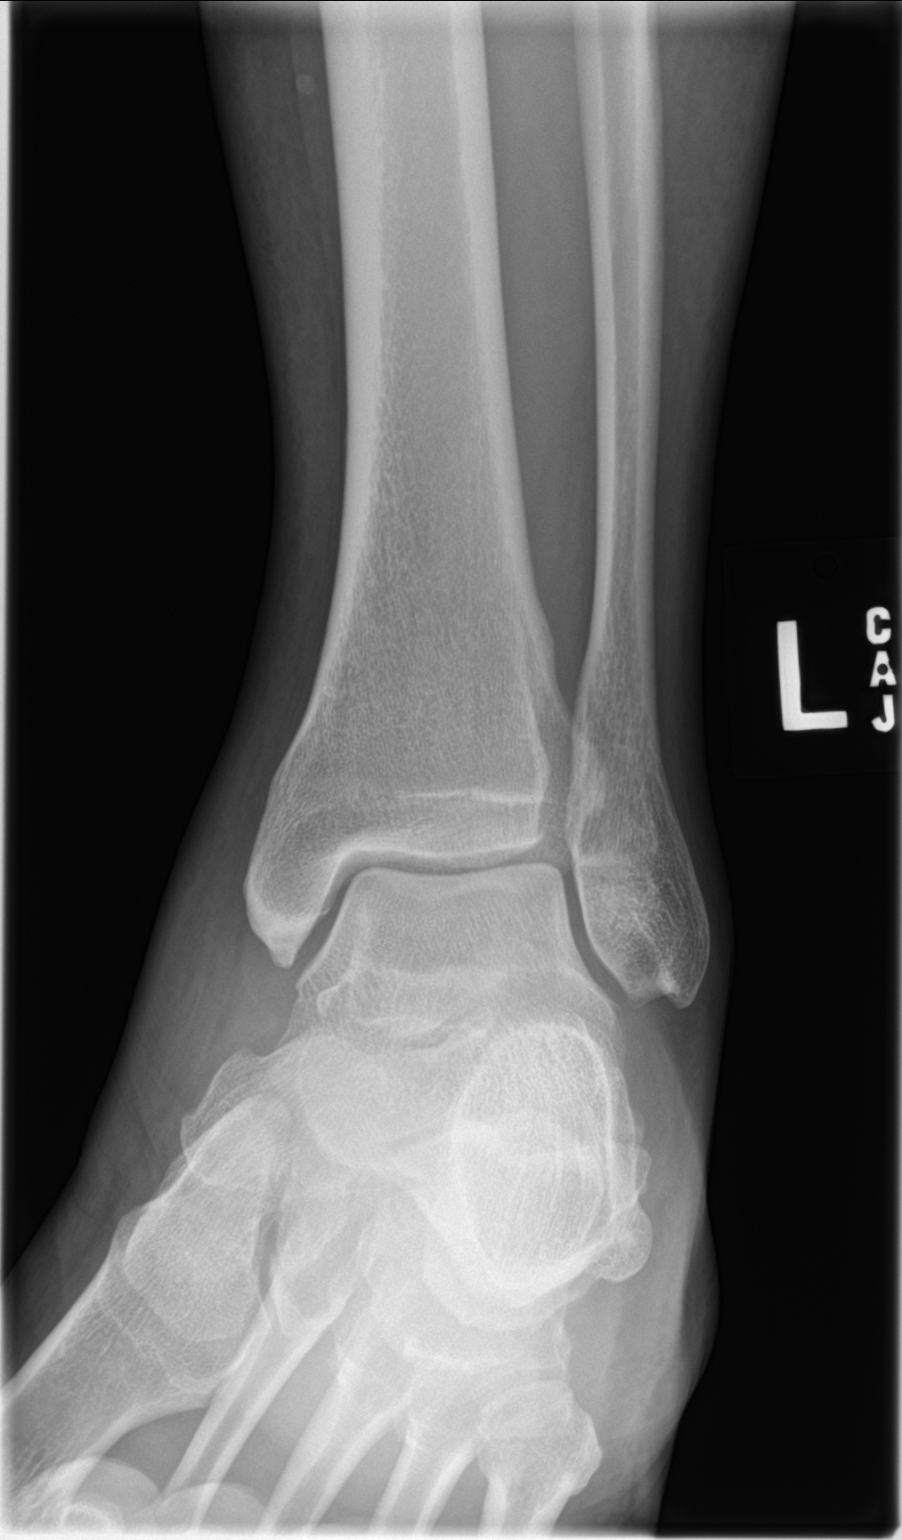

[ankle lat]
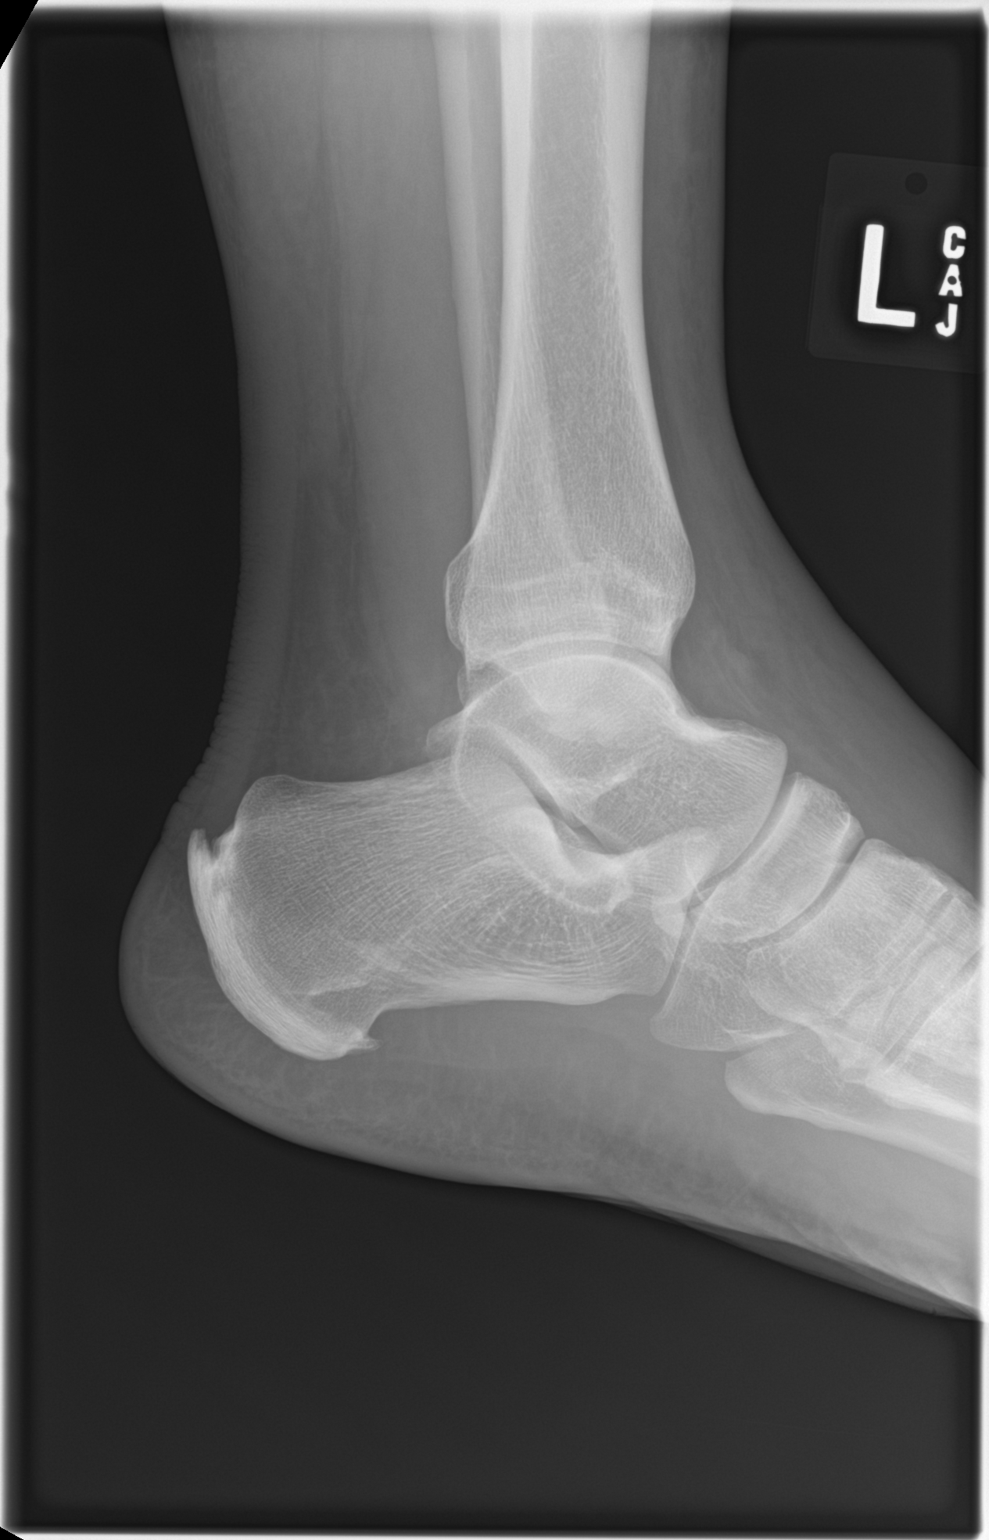

[3 of 3 positions shown; findings below may reference images not displayed]

FINDINGS: There is no evidence of fracture, dislocation, or joint effusion.
There is no evidence of arthropathy or other focal bone abnormality.
Soft tissues are unremarkable.
IMPRESSION: Negative.

## 2021-09-19 ENCOUNTER — Encounter: Payer: Self-pay | Admitting: *Deleted
